# Patient Record
Sex: Female | Born: 1935 | Race: White | Hispanic: No | Smoking: Former smoker
Health system: Southern US, Community
[De-identification: ages and names within clinical notes are randomized; demographics above are authoritative.]

## PROBLEM LIST (undated history)

## (undated) DIAGNOSIS — M8580 Other specified disorders of bone density and structure, unspecified site: Secondary | ICD-10-CM

## (undated) DIAGNOSIS — E079 Disorder of thyroid, unspecified: Secondary | ICD-10-CM

## (undated) DIAGNOSIS — K219 Gastro-esophageal reflux disease without esophagitis: Secondary | ICD-10-CM

## (undated) DIAGNOSIS — K635 Polyp of colon: Secondary | ICD-10-CM

## (undated) DIAGNOSIS — G43909 Migraine, unspecified, not intractable, without status migrainosus: Secondary | ICD-10-CM

## (undated) HISTORY — PX: TONSILLECTOMY AND ADENOIDECTOMY: SUR1326

## (undated) HISTORY — DX: Migraine, unspecified, not intractable, without status migrainosus: G43.909

## (undated) HISTORY — PX: KNEE SURGERY: SHX244

## (undated) HISTORY — DX: Disorder of thyroid, unspecified: E07.9

## (undated) HISTORY — DX: Other specified disorders of bone density and structure, unspecified site: M85.80

## (undated) HISTORY — DX: Gastro-esophageal reflux disease without esophagitis: K21.9

## (undated) HISTORY — DX: Polyp of colon: K63.5

---

## 2008-08-06 DIAGNOSIS — E785 Hyperlipidemia, unspecified: Secondary | ICD-10-CM | POA: Insufficient documentation

## 2008-08-06 DIAGNOSIS — N390 Urinary tract infection, site not specified: Secondary | ICD-10-CM | POA: Insufficient documentation

## 2008-10-11 DIAGNOSIS — G4700 Insomnia, unspecified: Secondary | ICD-10-CM | POA: Insufficient documentation

## 2008-10-11 DIAGNOSIS — E039 Hypothyroidism, unspecified: Secondary | ICD-10-CM | POA: Insufficient documentation

## 2012-06-14 LAB — HM DEXA SCAN

## 2013-12-27 DIAGNOSIS — K219 Gastro-esophageal reflux disease without esophagitis: Secondary | ICD-10-CM | POA: Insufficient documentation

## 2015-07-22 ENCOUNTER — Other Ambulatory Visit: Payer: Self-pay | Admitting: Geriatric Medicine

## 2015-07-22 ENCOUNTER — Other Ambulatory Visit: Payer: Self-pay | Admitting: Internal Medicine

## 2015-07-22 DIAGNOSIS — R51 Headache: Principal | ICD-10-CM

## 2015-07-22 DIAGNOSIS — R519 Headache, unspecified: Secondary | ICD-10-CM

## 2015-07-26 ENCOUNTER — Inpatient Hospital Stay: Admission: RE | Admit: 2015-07-26 | Payer: Self-pay | Source: Ambulatory Visit

## 2015-08-13 ENCOUNTER — Ambulatory Visit: Payer: Self-pay | Admitting: Neurology

## 2015-08-19 ENCOUNTER — Ambulatory Visit: Payer: Self-pay | Admitting: Neurology

## 2015-09-08 ENCOUNTER — Ambulatory Visit: Payer: Self-pay | Admitting: Neurology

## 2015-11-24 ENCOUNTER — Encounter: Payer: Self-pay | Admitting: Internal Medicine

## 2015-11-24 ENCOUNTER — Ambulatory Visit (INDEPENDENT_AMBULATORY_CARE_PROVIDER_SITE_OTHER): Payer: Medicare Other | Admitting: Internal Medicine

## 2015-11-24 ENCOUNTER — Other Ambulatory Visit (INDEPENDENT_AMBULATORY_CARE_PROVIDER_SITE_OTHER): Payer: Medicare Other

## 2015-11-24 VITALS — BP 122/82 | HR 58 | Temp 98.5°F | Resp 18 | Ht 64.5 in | Wt 127.0 lb

## 2015-11-24 DIAGNOSIS — E039 Hypothyroidism, unspecified: Secondary | ICD-10-CM

## 2015-11-24 DIAGNOSIS — K219 Gastro-esophageal reflux disease without esophagitis: Secondary | ICD-10-CM

## 2015-11-24 DIAGNOSIS — G4700 Insomnia, unspecified: Secondary | ICD-10-CM | POA: Diagnosis not present

## 2015-11-24 LAB — COMPREHENSIVE METABOLIC PANEL
ALBUMIN: 4.3 g/dL (ref 3.5–5.2)
ALK PHOS: 73 U/L (ref 39–117)
ALT: 16 U/L (ref 0–35)
AST: 27 U/L (ref 0–37)
BUN: 21 mg/dL (ref 6–23)
CALCIUM: 9.3 mg/dL (ref 8.4–10.5)
CO2: 27 mEq/L (ref 19–32)
Chloride: 99 mEq/L (ref 96–112)
Creatinine, Ser: 0.8 mg/dL (ref 0.40–1.20)
GFR: 72.75 mL/min (ref >60.00)
Glucose, Bld: 89 mg/dL (ref 70–99)
POTASSIUM: 4.4 meq/L (ref 3.5–5.1)
Sodium: 134 mEq/L — ABNORMAL LOW (ref 135–145)
TOTAL PROTEIN: 7.5 g/dL (ref 6.0–8.3)
Total Bilirubin: 0.5 mg/dL (ref 0.2–1.2)

## 2015-11-24 LAB — LIPID PANEL
Cholesterol: 189 mg/dL (ref 0–200)
HDL: 66.7 mg/dL (ref >39.00)
LDL Cholesterol: 105 mg/dL — ABNORMAL HIGH (ref 0–99)
NONHDL: 122.23
Total CHOL/HDL Ratio: 3
Triglycerides: 86 mg/dL (ref 0.0–149.0)
VLDL: 17.2 mg/dL (ref 0.0–40.0)

## 2015-11-24 LAB — VITAMIN B12: Vitamin B-12: 572 pg/mL (ref 211–911)

## 2015-11-24 LAB — CBC
HCT: 39.7 % (ref 36.0–46.0)
HEMOGLOBIN: 13.5 g/dL (ref 12.0–15.0)
MCHC: 33.9 g/dL (ref 30.0–36.0)
MCV: 91.8 fl (ref 78.0–100.0)
PLATELETS: 223 10*3/uL (ref 150.0–400.0)
RBC: 4.32 Mil/uL (ref 3.87–5.11)
RDW: 12.2 % (ref 11.5–15.5)
WBC: 7.1 10*3/uL (ref 4.0–10.5)

## 2015-11-24 LAB — TSH: TSH: 2.64 u[IU]/mL (ref 0.35–4.50)

## 2015-11-24 NOTE — Assessment & Plan Note (Signed)
Taking remeron with good success and will continue.

## 2015-11-24 NOTE — Assessment & Plan Note (Signed)
Checking TSH and adjust as needed. Taking synthroid 75 mcg daily.  

## 2015-11-24 NOTE — Patient Instructions (Signed)
We will check the labs today and call you back with the results.   Keep up the good work with your health.   If things are going well we can see you back with our wellness nurse to do your medicare wellness visit this fall and then back with me in 6-12 months.   Health Maintenance, Female Adopting a healthy lifestyle and getting preventive care can go a long way to promote health and wellness. Talk with your health care provider about what schedule of regular examinations is right for you. This is a good chance for you to check in with your provider about disease prevention and staying healthy. In between checkups, there are plenty of things you can do on your own. Experts have done a lot of research about which lifestyle changes and preventive measures are most likely to keep you healthy. Ask your health care provider for more information. WEIGHT AND DIET  Eat a healthy diet  Be sure to include plenty of vegetables, fruits, low-fat dairy products, and lean protein.  Do not eat a lot of foods high in solid fats, added sugars, or salt.  Get regular exercise. This is one of the most important things you can do for your health.  Most adults should exercise for at least 150 minutes each week. The exercise should increase your heart rate and make you sweat (moderate-intensity exercise).  Most adults should also do strengthening exercises at least twice a week. This is in addition to the moderate-intensity exercise.  Maintain a healthy weight  Body mass index (BMI) is a measurement that can be used to identify possible weight problems. It estimates body fat based on height and weight. Your health care provider can help determine your BMI and help you achieve or maintain a healthy weight.  For females 80 years of age and older:   A BMI below 18.5 is considered underweight.  A BMI of 18.5 to 24.9 is normal.  A BMI of 25 to 29.9 is considered overweight.  A BMI of 30 and above is considered  obese.  Watch levels of cholesterol and blood lipids  You should start having your blood tested for lipids and cholesterol at 80 years of age, then have this test every 5 years.  You may need to have your cholesterol levels checked more often if:  Your lipid or cholesterol levels are high.  You are older than 80 years of age.  You are at high risk for heart disease.  CANCER SCREENING   Lung Cancer  Lung cancer screening is recommended for adults 80-80 years old who are at high risk for lung cancer because of a history of smoking.  A yearly low-dose CT scan of the lungs is recommended for people who:  Currently smoke.  Have quit within the past 15 years.  Have at least a 30-pack-year history of smoking. A pack year is smoking an average of one pack of cigarettes a day for 1 year.  Yearly screening should continue until it has been 15 years since you quit.  Yearly screening should stop if you develop a health problem that would prevent you from having lung cancer treatment.  Breast Cancer  Practice breast self-awareness. This means understanding how your breasts normally appear and feel.  It also means doing regular breast self-exams. Let your health care provider know about any changes, no matter how small.  If you are in your 80s or 80s, you should have a clinical breast exam (CBE) by  a health care provider every 1-3 years as part of a regular health exam.  If you are 80 or older, have a CBE every year. Also consider having a breast X-ray (mammogram) every year.  If you have a family history of breast cancer, talk to your health care provider about genetic screening.  If you are at high risk for breast cancer, talk to your health care provider about having an MRI and a mammogram every year.  Breast cancer gene (BRCA) assessment is recommended for women who have family members with BRCA-related cancers. BRCA-related cancers  include:  Breast.  Ovarian.  Tubal.  Peritoneal cancers.  Results of the assessment will determine the need for genetic counseling and BRCA1 and BRCA2 testing. Cervical Cancer Your health care provider may recommend that you be screened regularly for cancer of the pelvic organs (ovaries, uterus, and vagina). This screening involves a pelvic examination, including checking for microscopic changes to the surface of your cervix (Pap test). You may be encouraged to have this screening done every 3 years, beginning at age 80.  For women ages 10-65, health care providers may recommend pelvic exams and Pap testing every 3 years, or they may recommend the Pap and pelvic exam, combined with testing for human papilloma virus (HPV), every 5 years. Some types of HPV increase your risk of cervical cancer. Testing for HPV may also be done on women of any age with unclear Pap test results.  Other health care providers may not recommend any screening for nonpregnant women who are considered low risk for pelvic cancer and who do not have symptoms. Ask your health care provider if a screening pelvic exam is right for you.  If you have had past treatment for cervical cancer or a condition that could lead to cancer, you need Pap tests and screening for cancer for at least 20 years after your treatment. If Pap tests have been discontinued, your risk factors (such as having a new sexual partner) need to be reassessed to determine if screening should resume. Some women have medical problems that increase the chance of getting cervical cancer. In these cases, your health care provider may recommend more frequent screening and Pap tests. Colorectal Cancer  This type of cancer can be detected and often prevented.  Routine colorectal cancer screening usually begins at 80 years of age and continues through 80 years of age.  Your health care provider may recommend screening at an earlier age if you have risk factors for  colon cancer.  Your health care provider may also recommend using home test kits to check for hidden blood in the stool.  A small camera at the end of a tube can be used to examine your colon directly (sigmoidoscopy or colonoscopy). This is done to check for the earliest forms of colorectal cancer.  Routine screening usually begins at age 61.  Direct examination of the colon should be repeated every 5-10 years through 80 years of age. However, you may need to be screened more often if early forms of precancerous polyps or small growths are found. Skin Cancer  Check your skin from head to toe regularly.  Tell your health care provider about any new moles or changes in moles, especially if there is a change in a mole's shape or color.  Also tell your health care provider if you have a mole that is larger than the size of a pencil eraser.  Always use sunscreen. Apply sunscreen liberally and repeatedly throughout the day.  Protect yourself by wearing long sleeves, pants, a wide-brimmed hat, and sunglasses whenever you are outside. HEART DISEASE, DIABETES, AND HIGH BLOOD PRESSURE   High blood pressure causes heart disease and increases the risk of stroke. High blood pressure is more likely to develop in:  People who have blood pressure in the high end of the normal range (130-139/85-89 mm Hg).  People who are overweight or obese.  People who are African American.  If you are 18-39 years of age, have your blood pressure checked every 3-5 years. If you are 40 years of age or older, have your blood pressure checked every year. You should have your blood pressure measured twice--once when you are at a hospital or clinic, and once when you are not at a hospital or clinic. Record the average of the two measurements. To check your blood pressure when you are not at a hospital or clinic, you can use:  An automated blood pressure machine at a pharmacy.  A home blood pressure monitor.  If you  are between 55 years and 79 years old, ask your health care provider if you should take aspirin to prevent strokes.  Have regular diabetes screenings. This involves taking a blood sample to check your fasting blood sugar level.  If you are at a normal weight and have a low risk for diabetes, have this test once every three years after 80 years of age.  If you are overweight and have a high risk for diabetes, consider being tested at a younger age or more often. PREVENTING INFECTION  Hepatitis B  If you have a higher risk for hepatitis B, you should be screened for this virus. You are considered at high risk for hepatitis B if:  You were born in a country where hepatitis B is common. Ask your health care provider which countries are considered high risk.  Your parents were born in a high-risk country, and you have not been immunized against hepatitis B (hepatitis B vaccine).  You have HIV or AIDS.  You use needles to inject street drugs.  You live with someone who has hepatitis B.  You have had sex with someone who has hepatitis B.  You get hemodialysis treatment.  You take certain medicines for conditions, including cancer, organ transplantation, and autoimmune conditions. Hepatitis C  Blood testing is recommended for:  Everyone born from 1945 through 1965.  Anyone with known risk factors for hepatitis C. Sexually transmitted infections (STIs)  You should be screened for sexually transmitted infections (STIs) including gonorrhea and chlamydia if:  You are sexually active and are younger than 80 years of age.  You are older than 80 years of age and your health care provider tells you that you are at risk for this type of infection.  Your sexual activity has changed since you were last screened and you are at an increased risk for chlamydia or gonorrhea. Ask your health care provider if you are at risk.  If you do not have HIV, but are at risk, it may be recommended that you  take a prescription medicine daily to prevent HIV infection. This is called pre-exposure prophylaxis (PrEP). You are considered at risk if:  You are sexually active and do not regularly use condoms or know the HIV status of your partner(s).  You take drugs by injection.  You are sexually active with a partner who has HIV. Talk with your health care provider about whether you are at high risk of being infected   with HIV. If you choose to begin PrEP, you should first be tested for HIV. You should then be tested every 3 months for as long as you are taking PrEP.  PREGNANCY   If you are premenopausal and you may become pregnant, ask your health care provider about preconception counseling.  If you may become pregnant, take 400 to 800 micrograms (mcg) of folic acid every day.  If you want to prevent pregnancy, talk to your health care provider about birth control (contraception). OSTEOPOROSIS AND MENOPAUSE   Osteoporosis is a disease in which the bones lose minerals and strength with aging. This can result in serious bone fractures. Your risk for osteoporosis can be identified using a bone density scan.  If you are 28 years of age or older, or if you are at risk for osteoporosis and fractures, ask your health care provider if you should be screened.  Ask your health care provider whether you should take a calcium or vitamin D supplement to lower your risk for osteoporosis.  Menopause may have certain physical symptoms and risks.  Hormone replacement therapy may reduce some of these symptoms and risks. Talk to your health care provider about whether hormone replacement therapy is right for you.  HOME CARE INSTRUCTIONS   Schedule regular health, dental, and eye exams.  Stay current with your immunizations.   Do not use any tobacco products including cigarettes, chewing tobacco, or electronic cigarettes.  If you are pregnant, do not drink alcohol.  If you are breastfeeding, limit how  much and how often you drink alcohol.  Limit alcohol intake to no more than 1 drink per day for nonpregnant women. One drink equals 12 ounces of beer, 5 ounces of wine, or 1 ounces of hard liquor.  Do not use street drugs.  Do not share needles.  Ask your health care provider for help if you need support or information about quitting drugs.  Tell your health care provider if you often feel depressed.  Tell your health care provider if you have ever been abused or do not feel safe at home.   This information is not intended to replace advice given to you by your health care provider. Make sure you discuss any questions you have with your health care provider.   Document Released: 12/14/2010 Document Revised: 06/21/2014 Document Reviewed: 05/02/2013 Elsevier Interactive Patient Education [employer].

## 2015-11-24 NOTE — Assessment & Plan Note (Signed)
Taking prilosec and at this time benefit outweighs risk.

## 2015-11-24 NOTE — Progress Notes (Signed)
   Subjective:    Patient ID: Rachel ParkinsJulia Morillo, female    DOB: 01/11/1931, 82 y.o.   MRN: [id number]  HPI The patient is an 82 YO female coming in new for continuation of her medical care including her: thyroid (taking synthroid 75 mcg daily for some time, no constipation, diarrhea, heat or cold intolerance), her insomnia (taking remeron for that with good success, no daytime side effects, sleeping 6-7 hours per night), and her gerd (taking prilosec with good relief, if she misses a dose she notices). No new concerns. She is doing well overall.   [facility], social history reviewed and updated.   Review of Systems  Constitutional: Negative for fever, activity change, appetite change, fatigue and unexpected weight change.  HENT: Negative.   Eyes: Negative.   Respiratory: Negative for cough, chest tightness, shortness of breath and wheezing.   Cardiovascular: Negative for chest pain, palpitations and leg swelling.  Gastrointestinal: Negative for nausea, abdominal pain, diarrhea, constipation and abdominal distention.  Musculoskeletal: Negative.   Skin: Negative.   Neurological: Negative.   Psychiatric/Behavioral: Negative.       Objective:   Physical Exam  Constitutional: She is oriented to person, place, and time. She appears well-developed and well-nourished.  HENT:  Head: Normocephalic and atraumatic.  Eyes: EOM are normal.  Neck: Normal range of motion.  Cardiovascular: Normal rate and regular rhythm.   Pulmonary/Chest: Effort normal and breath sounds normal. No respiratory distress. She has no wheezes. She has no rales.  Abdominal: Soft. Bowel sounds are normal. She exhibits no distension. There is no tenderness. There is no rebound.  Musculoskeletal: She exhibits no edema.  Neurological: She is alert and oriented to person, place, and time. Coordination normal.  Skin: Skin is warm and dry.  Psychiatric: She has a normal mood and affect.   Filed Vitals:   11/24/15 1421  BP: 122/82    Pulse: 58  Temp: 98.5 F (36.9 C)  TempSrc: Oral  Resp: 18  Height: 5' 4.5" (1.638 m)  Weight: 127 lb (57.607 kg)  SpO2: 98%      Assessment & Plan:

## 2015-11-24 NOTE — Progress Notes (Signed)
Pre visit review using our clinic review tool, if applicable. No additional management support is needed unless otherwise documented below in the visit note. 

## 2015-11-25 ENCOUNTER — Ambulatory Visit: Payer: Medicare Other

## 2015-11-26 ENCOUNTER — Telehealth: Payer: Self-pay

## 2015-11-26 NOTE — Telephone Encounter (Signed)
Call to the patient for her AWV; States she is really in very good health; Works out every day; [facility] just there to complete her annual exam and explained that [facility] is not accountable for meeting our metrics. But in lieu of her good health., will defer to Dr. Okey Duprerawford at her CPE. Agreed to this and appreciated the feedback  Noted added for Dr. Okey Duprerawford to complete AWV in Dec.

## 2015-12-08 ENCOUNTER — Telehealth: Payer: Self-pay | Admitting: Internal Medicine

## 2015-12-08 NOTE — Telephone Encounter (Signed)
[facility] 15 pages to Dr.Crawford

## 2015-12-20 ENCOUNTER — Telehealth: Payer: Self-pay

## 2015-12-20 NOTE — Telephone Encounter (Signed)
Patient sent the following request via our website ( for non-medical questions) * Please inform her to call or use MyChart in the future so that her response is not delayed*  I do not now have cystitis but would like to have a supply of CIPROFLOXACIN 250MG  on hand in case I need it. [facility]. Thanks. Leonides SakeJulie Mcclard

## 2015-12-22 ENCOUNTER — Telehealth: Payer: Self-pay | Admitting: Internal Medicine

## 2015-12-22 MED ORDER — CIPROFLOXACIN HCL 500 MG PO TABS: 500.0000 mg | ORAL_TABLET | Freq: Two times a day (BID) | ORAL | Status: DC

## 2015-12-22 NOTE — Telephone Encounter (Signed)
Generally I do not give out antibiotics for people to have on hand but am willing to work with people if they do have UTI symptoms to make sure they get treatment.

## 2015-12-22 NOTE — Telephone Encounter (Signed)
7.10.17 Pt came in stated that she gets frequent UTI's and would like to keep cipro on hand this summer. She said she addressed this during her last visit with you and you said you would. Are you willing to send some in for her? Please advise, thanks.

## 2015-12-22 NOTE — Telephone Encounter (Signed)
Sent in the prescription of cipro.

## 2015-12-22 NOTE — Telephone Encounter (Signed)
Patient aware.

## 2015-12-29 NOTE — Telephone Encounter (Signed)
Dr. Okey Duprerawford sent in abx for patient.

## 2015-12-29 NOTE — Telephone Encounter (Signed)
Can you follow up with the patient with Dr. Frutoso Chaserawford's response. The patient requested meds via the website, she really needs to call us or use MyChart.

## 2016-04-06 ENCOUNTER — Ambulatory Visit (INDEPENDENT_AMBULATORY_CARE_PROVIDER_SITE_OTHER): Payer: Medicare Other | Admitting: Internal Medicine

## 2016-04-06 ENCOUNTER — Encounter: Payer: Self-pay | Admitting: Internal Medicine

## 2016-04-06 ENCOUNTER — Other Ambulatory Visit (INDEPENDENT_AMBULATORY_CARE_PROVIDER_SITE_OTHER): Payer: Medicare Other

## 2016-04-06 VITALS — BP 118/68 | HR 68 | Temp 97.6°F | Resp 16 | Ht 64.5 in | Wt 125.5 lb

## 2016-04-06 DIAGNOSIS — E038 Other specified hypothyroidism: Secondary | ICD-10-CM

## 2016-04-06 DIAGNOSIS — R197 Diarrhea, unspecified: Secondary | ICD-10-CM

## 2016-04-06 DIAGNOSIS — K580 Irritable bowel syndrome with diarrhea: Secondary | ICD-10-CM | POA: Diagnosis not present

## 2016-04-06 LAB — COMPREHENSIVE METABOLIC PANEL
ALT: 10 U/L (ref 0–35)
AST: 22 U/L (ref 0–37)
Albumin: 4.2 g/dL (ref 3.5–5.2)
Alkaline Phosphatase: 75 U/L (ref 39–117)
BUN: 18 mg/dL (ref 6–23)
CALCIUM: 9.5 mg/dL (ref 8.4–10.5)
CHLORIDE: 99 meq/L (ref 96–112)
CO2: 32 mEq/L (ref 19–32)
Creatinine, Ser: 0.87 mg/dL (ref 0.40–1.20)
GFR: 65.98 mL/min (ref >60.00)
Glucose, Bld: 91 mg/dL (ref 70–99)
Potassium: 4.9 mEq/L (ref 3.5–5.1)
Sodium: 135 mEq/L (ref 135–145)
Total Bilirubin: 0.7 mg/dL (ref 0.2–1.2)
Total Protein: 7.1 g/dL (ref 6.0–8.3)

## 2016-04-06 LAB — CBC WITH DIFFERENTIAL/PLATELET
BASOS PCT: 0.3 % (ref 0.0–3.0)
Basophils Absolute: 0 10*3/uL (ref 0.0–0.1)
EOS PCT: 0.7 % (ref 0.0–5.0)
Eosinophils Absolute: 0 10*3/uL (ref 0.0–0.7)
HEMATOCRIT: 38.5 % (ref 36.0–46.0)
HEMOGLOBIN: 13.1 g/dL (ref 12.0–15.0)
LYMPHS PCT: 26 % (ref 12.0–46.0)
Lymphs Abs: 1.4 10*3/uL (ref 0.7–4.0)
MCHC: 34.2 g/dL (ref 30.0–36.0)
MCV: 92.7 fl (ref 78.0–100.0)
Monocytes Absolute: 0.5 10*3/uL (ref 0.1–1.0)
Monocytes Relative: 9.4 % (ref 3.0–12.0)
NEUTROS ABS: 3.4 10*3/uL (ref 1.4–7.7)
Neutrophils Relative %: 63.6 % (ref 43.0–77.0)
PLATELETS: 217 10*3/uL (ref 150.0–400.0)
RBC: 4.15 Mil/uL (ref 3.87–5.11)
RDW: 12 % (ref 11.5–15.5)
WBC: 5.4 10*3/uL (ref 4.0–10.5)

## 2016-04-06 LAB — THYROID PANEL WITH TSH
FREE THYROXINE INDEX: 2.8 (ref 1.4–3.8)
T3 UPTAKE: 37 % — AB (ref 22–35)
T4 TOTAL: 7.7 ug/dL (ref 4.5–12.0)
TSH: 2.47 m[IU]/L

## 2016-04-06 LAB — C-REACTIVE PROTEIN: CRP: 0.1 mg/dL — ABNORMAL LOW (ref 0.5–20.0)

## 2016-04-06 MED ORDER — ELUXADOLINE 100 MG PO TABS: 1.0000 | ORAL_TABLET | Freq: Every day | ORAL | 1 refills | Status: DC

## 2016-04-06 MED ORDER — LEVOTHYROXINE SODIUM 75 MCG PO TABS: 75.0000 ug | ORAL_TABLET | Freq: Every day | ORAL | 1 refills | Status: DC

## 2016-04-06 NOTE — Progress Notes (Signed)
Pre visit review using our clinic review tool, if applicable. No additional management support is needed unless otherwise documented below in the visit note. 

## 2016-04-06 NOTE — Patient Instructions (Signed)
Chronic Diarrhea Diarrhea is frequent loose and watery bowel movements. It can cause you to feel weak and dehydrated. Dehydration can cause you to become tired and thirsty and to have a dry mouth, decreased urination, and dark yellow urine. Diarrhea is a sign of another problem, most often an infection that will not last long. In most cases, diarrhea lasts 2-3 days. Diarrhea that lasts longer than 4 weeks is called long-lasting (chronic) diarrhea. It is important to treat your diarrhea as directed by your health care provider to lessen or prevent future episodes of diarrhea.  CAUSES  There are many causes of chronic diarrhea. The following are some possible causes:   Gastrointestinal infections caused by viruses, bacteria, or parasites.   Food poisoning or food allergies.   Certain medicines, such as antibiotics, chemotherapy, and laxatives.   Artificial sweeteners and fructose.   Digestive disorders, such as celiac disease and inflammatory bowel diseases.   Irritable bowel syndrome.  Some disorders of the pancreas.  Disorders of the thyroid.  Reduced blood flow to the intestines.  Cancer. Sometimes the cause of chronic diarrhea is unknown. RISK FACTORS  Having a severely weakened immune system, such as from HIV or AIDS.   Taking certain types of cancer-fighting drugs (such as with chemotherapy) or other medicines.   Having had a recent organ transplant.   Having a portion of the stomach or small bowel removed.   Traveling to countries where food and water supplies are often contaminated.  SYMPTOMS  In addition to frequent, loose stools, diarrhea may cause:   Cramping.   Abdominal pain.   Nausea.   Fever.  Fatigue.  Urgent need to use the bathroom.  Loss of bowel control. DIAGNOSIS  Your health care provider must take a careful history and perform a physical exam. Tests given are based on your symptoms and history. Tests may include:   Blood or  stool tests. Three or more stool samples may be examined. Stool cultures may be used to test for bacteria or parasites.   X-rays.   A procedure in which a thin tube is inserted into the mouth or rectum (endoscopy). This allows the health care provider to look inside the intestine.  TREATMENT   Treatment is aimed at correcting the cause of the diarrhea when possible.  Diarrhea caused by an infection can often be treated with antibiotic medicines.  Diarrhea not caused by an infection may require you to take long-term medicine or have surgery. Specific treatment should be discussed with your health care provider.  If the cause cannot be determined, treatment aims to relieve symptoms and prevent dehydration. Serious health problems can occur if you do not maintain proper fluid levels. Treatment may include:  Taking an oral rehydration solution (ORS).  Not drinking beverages that contain caffeine (such as tea, coffee, and soft drinks).  Not drinking alcohol.  Maintaining well-balanced nutrition to help you recover faster. HOME CARE INSTRUCTIONS   Drink enough fluids to keep urine clear or pale yellow. Drink 1 cup (8 oz) of fluid for each diarrhea episode. Avoid fluids that contain simple sugars, fruit juices, whole milk products, and sodas. Hydrate with an ORS. You may purchase the ORS or prepare it at home by mixing the following ingredients together:   - tsp (1.7-3  mL) table salt.   tsp (3  mL) baking soda.   tsp (1.7 mL) salt substitute containing potassium chloride.  1 tbsp (20 mL) sugar.  4.2 c (1 L) of water.     Certain foods and beverages may increase the speed at which food moves through the gastrointestinal (GI) tract. These foods and beverages should be avoided. They include:  Caffeinated and alcoholic beverages.  High-fiber foods, such as raw fruits and vegetables, nuts, seeds, and whole grain breads and cereals.  Foods and beverages sweetened with sugar  alcohols, such as xylitol, sorbitol, and mannitol.   Some foods may be well tolerated and may help thicken stool. These include:  Starchy foods, such as rice, toast, pasta, low-sugar cereal, oatmeal, grits, baked potatoes, crackers, and bagels.  Bananas.  Applesauce.  Add probiotic-rich foods to help increase healthy bacteria in the GI tract. These include yogurt and fermented milk products.  Wash your hands well after each diarrhea episode.  Only take over-the-counter or prescription medicines as directed by your health care provider.  Take a warm bath to relieve any burning or pain from frequent diarrhea episodes. SEEK MEDICAL CARE IF:   You are not urinating as often.  Your urine is a dark color.  You become very tired or dizzy.  You have severe pain in the abdomen or rectum.  Your have blood or pus in your stools.  Your stools look black and tarry. SEEK IMMEDIATE MEDICAL CARE IF:   You are unable to keep fluids down.  You have persistent vomiting.  You have blood in your stool.  Your stools are black and tarry.  You do not urinate in 6-8 hours, or there is only a small amount of very dark urine.  You have abdominal pain that increases or localizes.  You have weakness, dizziness, confusion, or lightheadedness.  You have a severe headache.  Your diarrhea gets worse or does not get better.  You have a fever or persistent symptoms for more than 2-3 days.  You have a fever and your symptoms suddenly get worse. MAKE SURE YOU:   Understand these instructions.  Will watch your condition.  Will get help right away if you are not doing well or get worse.   This information is not intended to replace advice given to you by your health care provider. Make sure you discuss any questions you have with your health care provider.   Document Released: 08/21/2003 Document Revised: 06/05/2013 Document Reviewed: 11/23/2012 Elsevier Interactive Patient Education 2016  [employer]  

## 2016-04-06 NOTE — Progress Notes (Signed)
Subjective:  Patient ID: Rachel Ferguson, female    DOB: June 13, 1931  Age: 82 y.o. MRN: [id number]  CC: Hypothyroidism and Diarrhea  NEW TO ME  HPI [name] presents for concerns about loose bowel movements. She said she doesn't have a great memory but she thinks she's had some trouble with this throughout her life. Nonetheless, for the last 6 months or so her symptoms have worsened. She complains of having about 4-5 loose bowel movements per day with stool urgency. She has rare abdominal cramping. She has noticed no associations with wheat products, dairy products, or other food substances. She takes Imodium intermittently and it does provide symptom relief. She is not awakened during the night with diarrhea. She has not seen any blood in her stools and she denies nausea, vomiting, loss of appetite, weight loss. She tells me she has done a Microbiologist and is convinced she has IBS with diarrhea.  Outpatient Medications Prior to Visit  Medication Sig Dispense Refill  . mirtazapine (REMERON) 30 MG tablet Take 7.5 mg by mouth at bedtime.     Marland Kitchen omeprazole (PRILOSEC) 20 MG capsule Take 20 mg by mouth daily.     . ciprofloxacin (CIPRO) 500 MG tablet Take 1 tablet (500 mg total) by mouth 2 (two) times daily. 6 tablet 0  . levothyroxine (SYNTHROID, LEVOTHROID) 75 MCG tablet Take 75 mcg by mouth daily before breakfast.      No facility-administered medications prior to visit.     ROS Review of Systems  Constitutional: Negative for activity change, appetite change, chills, diaphoresis, fatigue, fever and unexpected weight change.  HENT: Negative.  Negative for trouble swallowing.   Eyes: Negative for visual disturbance.  Respiratory: Negative for cough, choking, chest tightness, shortness of breath and stridor.   Cardiovascular: Negative for chest pain, palpitations and leg swelling.  Gastrointestinal: Positive for diarrhea. Negative for abdominal pain, blood in stool, constipation, nausea and  vomiting.  Endocrine: Negative.   Genitourinary: Negative.  Negative for decreased urine volume, difficulty urinating and dysuria.  Musculoskeletal: Negative.   Skin: Negative.  Negative for color change and rash.  Allergic/Immunologic: Negative.   Neurological: Negative.  Negative for dizziness, weakness and light-headedness.  Hematological: Negative.  Negative for adenopathy. Does not bruise/bleed easily.  Psychiatric/Behavioral: Negative.  Negative for sleep disturbance. The patient is not nervous/anxious.     Objective:  BP 118/68 (BP Location: Left Arm, Patient Position: Sitting, Cuff Size: Normal)   Pulse 68   Temp 97.6 F (36.4 C) (Oral)   Resp 16   Ht 5' 4.5" (1.638 m)   Wt 125 lb 8 oz (56.9 kg)   SpO2 97%   BMI 21.21 kg/m   BP Readings from Last 3 Encounters:  04/06/16 118/68  11/24/15 122/82    Wt Readings from Last 3 Encounters:  04/06/16 125 lb 8 oz (56.9 kg)  11/24/15 127 lb (57.6 kg)    Physical Exam  Constitutional: She is oriented to person, place, and time. No distress.  HENT:  Mouth/Throat: Oropharynx is clear and moist. No oropharyngeal exudate.  Eyes: Conjunctivae are normal. Right eye exhibits no discharge. Left eye exhibits no discharge. No scleral icterus.  Neck: Normal range of motion. Neck supple. No JVD present. No tracheal deviation present.  Cardiovascular: Normal rate, regular rhythm, normal heart sounds and intact distal pulses.  Exam reveals no gallop and no friction rub.   No murmur heard. Pulmonary/Chest: Effort normal and breath sounds normal. No stridor. No respiratory distress. She has  no wheezes. She has no rales. She exhibits no tenderness.  Abdominal: Soft. Bowel sounds are normal. She exhibits no distension and no mass. There is no tenderness. There is no rebound and no guarding.  Musculoskeletal: Normal range of motion. She exhibits no edema, tenderness or deformity.  Lymphadenopathy:    She has no cervical adenopathy.    Neurological: She is oriented to person, place, and time.  Skin: Skin is warm and dry. No rash noted. She is not diaphoretic. No erythema. No pallor.  Psychiatric: She has a normal mood and affect. Her behavior is normal. Judgment and thought content normal.  Vitals reviewed.   Lab Results  Component Value Date   WBC 5.4 04/06/2016   HGB 13.1 04/06/2016   HCT 38.5 04/06/2016   PLT 217.0 04/06/2016   GLUCOSE 91 04/06/2016   CHOL 189 11/24/2015   TRIG 86.0 11/24/2015   HDL 66.70 11/24/2015   LDLCALC 105 (H) 11/24/2015   ALT 10 04/06/2016   AST 22 04/06/2016   NA 135 04/06/2016   K 4.9 04/06/2016   CL 99 04/06/2016   CREATININE 0.87 04/06/2016   BUN 18 04/06/2016   CO2 32 04/06/2016   TSH 2.47 04/06/2016    Patient was never admitted.  Assessment & Plan:   Derrika was seen today for hypothyroidism and diarrhea.  Diagnoses and all orders for this visit:  Other specified hypothyroidism- Her TSH is in the normal range, she will remain on the current dose of levothyroxine -     CBC with Differential/Platelet; Future -     Thyroid Panel With TSH; Future  Irritable bowel syndrome with diarrhea- she will try a course of Viberzi to see if this helps control her symptoms. -     Eluxadoline (VIBERZI) 100 MG TABS; Take 1 tablet by mouth daily.  Diarrhea, unspecified type- I am suspicious that she has IBS with diarrhea so I have asked her try a course of Viberzi, will also screen her for celiac disease, exocrine pancreatic insufficiency, bacterial overgrowth, C. difficile infection, inflammatory bowel disease, hyperthyroidism, abnormal electrolytes, anemia, low protein states. -     C-reactive protein; Future -     Clostridium difficile EIA; Future -     Giardia/cryptosporidium (EIA); Future -     Fecal lactoferrin, quant; Future -     Ova and parasite examination; Future -     Comprehensive metabolic panel; Future -     CBC with Differential/Platelet; Future -     Pancreatic  Elastase, Fecal -     Gliadin antibodies, serum; Future -     Tissue transglutaminase, IgA; Future -     Reticulin Antibody, IgA w reflex titer; Future -     Thyroid Panel With TSH; Future  Other orders -     levothyroxine (SYNTHROID, LEVOTHROID) 75 MCG tablet; Take 1 tablet (75 mcg total) by mouth daily before breakfast.   I have discontinued Ms. Seher's ciprofloxacin. I have also changed her levothyroxine. Additionally, I am having her start on Eluxadoline. Lastly, I am having her maintain her mirtazapine and omeprazole.  Meds ordered this encounter  Medications  . levothyroxine (SYNTHROID, LEVOTHROID) 75 MCG tablet    Sig: Take 1 tablet (75 mcg total) by mouth daily before breakfast.    Dispense:  90 tablet    Refill:  1  . Eluxadoline (VIBERZI) 100 MG TABS    Sig: Take 1 tablet by mouth daily.    Dispense:  90 tablet    Refill:  1     Follow-up: Return in about 3 weeks (around 04/27/2016).  Sanda Lingerhomas Daria Mcmeekin, MD

## 2016-04-08 ENCOUNTER — Encounter: Payer: Self-pay | Admitting: Internal Medicine

## 2016-04-08 LAB — GLIADIN ANTIBODIES, SERUM
Gliadin IgA: 12 Units (ref <20)
Gliadin IgG: 2 Units (ref <20)

## 2016-04-08 LAB — RETICULIN ANTIBODIES, IGA W TITER: Reticulin Ab, IgA: NEGATIVE

## 2016-04-08 LAB — TISSUE TRANSGLUTAMINASE, IGA: Tissue Transglutaminase Ab, IgA: 1 U/mL (ref <4)

## 2016-04-12 ENCOUNTER — Telehealth: Payer: Self-pay | Admitting: Emergency Medicine

## 2016-04-12 ENCOUNTER — Other Ambulatory Visit: Payer: Self-pay | Admitting: Internal Medicine

## 2016-04-12 ENCOUNTER — Other Ambulatory Visit: Payer: Medicare Other

## 2016-04-12 DIAGNOSIS — R197 Diarrhea, unspecified: Secondary | ICD-10-CM

## 2016-04-12 DIAGNOSIS — L989 Disorder of the skin and subcutaneous tissue, unspecified: Secondary | ICD-10-CM

## 2016-04-12 NOTE — Telephone Encounter (Signed)
Pt called and stated her dermatologist is retiring and wants to know if she can be referred to a new one. Please advise thanks.

## 2016-04-13 LAB — C. DIFFICILE GDH AND TOXIN A/B
C. DIFFICILE GDH: NOT DETECTED
C. difficile Toxin A/B: NOT DETECTED

## 2016-04-13 LAB — OVA AND PARASITE EXAMINATION: OP: NONE SEEN

## 2016-04-13 LAB — FECAL LACTOFERRIN, QUANT: LACTOFERRIN: POSITIVE

## 2016-04-13 NOTE — Telephone Encounter (Signed)
What is she needing referral for? We need a reason for the referral.

## 2016-04-14 ENCOUNTER — Encounter: Payer: Self-pay | Admitting: Internal Medicine

## 2016-04-14 NOTE — Telephone Encounter (Signed)
Patient wants to be checked all over to make sure she is not having any problems. She said she has not been in a year and her old dermatologist is retiring. Please advise, thanks.

## 2016-04-14 NOTE — Telephone Encounter (Signed)
Referral placed.

## 2016-04-14 NOTE — Addendum Note (Signed)
Addended by: Hillard DankerRAWFORD, ELIZABETH A on: 04/14/2016 10:37 AM   Modules accepted: Orders

## 2016-04-15 LAB — GIARDIA/CRYPTOSPORIDIUM (EIA)

## 2016-04-27 ENCOUNTER — Encounter: Payer: Self-pay | Admitting: Internal Medicine

## 2016-04-27 ENCOUNTER — Ambulatory Visit (INDEPENDENT_AMBULATORY_CARE_PROVIDER_SITE_OTHER): Payer: Medicare Other | Admitting: Internal Medicine

## 2016-04-27 DIAGNOSIS — R197 Diarrhea, unspecified: Secondary | ICD-10-CM

## 2016-04-27 NOTE — Progress Notes (Signed)
   Subjective:    Patient ID: Rachel ParkinsJulia Ferguson, female    DOB: 03/21/1931, 82 y.o.   MRN: [id number]  HPI The patient is an 82 YO female coming in for follow up of some diarrhea. She was seen several weeks ago for acute flare of diarrhea with a hard time getting off the toilet due to multiple soft stools. Since that time she was given viberzi and tried that. This gave her horrible side effects and she was unable to leave the bed. She got rid of the rest. She had improvement on her own. No real change to her diet or exercise before the worsening or change to cause the improvement. She did not have any blood in the bowels. She did not get the results of her stool studies and wants to review those. Has taken imodium and it constipated her for 2-3 days. No weight change.  Review of Systems  Constitutional: Negative.   Respiratory: Negative.   Cardiovascular: Negative.   Gastrointestinal: Positive for diarrhea. Negative for abdominal distention, abdominal pain, constipation, nausea and vomiting.       Much improved  Musculoskeletal: Negative.   Neurological: Negative.       Objective:   Physical Exam  Constitutional: She is oriented to person, place, and time. She appears well-developed and well-nourished.  Cardiovascular: Normal rate and regular rhythm.   Pulmonary/Chest: Effort normal. No respiratory distress. She has no wheezes. She has no rales.  Abdominal: Soft. Bowel sounds are normal. She exhibits no distension and no mass. There is no tenderness. There is no rebound and no guarding.  Neurological: She is alert and oriented to person, place, and time.  Skin: Skin is warm and dry.   Vitals:   04/27/16 1047  BP: 130/78  Pulse: 60  Resp: 14  Temp: 98.4 F (36.9 C)  TempSrc: Oral  SpO2: 98%  Weight: 128 lb (58.1 kg)  Height: 5\' 4"  (1.626 m)      Assessment & Plan:

## 2016-04-27 NOTE — Patient Instructions (Signed)
It is fine to push the physical back to next year.   Consider taking some imodium with you on the trip just in case and you can use 1/2 pill if needed.

## 2016-04-27 NOTE — Progress Notes (Signed)
Pre visit review using our clinic review tool, if applicable. No additional management support is needed unless otherwise documented below in the visit note. 

## 2016-04-27 NOTE — Assessment & Plan Note (Signed)
Reviewed normal stool and labs results with her at the visit. Advised her to stop viberzi and not resume given the side effects. Advised that she can try 1/2 imodium to see if this is able to give her some relief without constipating her.

## 2016-05-05 ENCOUNTER — Telehealth: Payer: Self-pay | Admitting: Internal Medicine

## 2016-05-05 ENCOUNTER — Ambulatory Visit (INDEPENDENT_AMBULATORY_CARE_PROVIDER_SITE_OTHER): Payer: Medicare Other | Admitting: Physician Assistant

## 2016-05-05 VITALS — BP 138/78 | HR 66 | Temp 98.0°F | Resp 17 | Ht 65.0 in | Wt 125.0 lb

## 2016-05-05 DIAGNOSIS — R05 Cough: Secondary | ICD-10-CM

## 2016-05-05 DIAGNOSIS — R0981 Nasal congestion: Secondary | ICD-10-CM | POA: Diagnosis not present

## 2016-05-05 DIAGNOSIS — R059 Cough, unspecified: Secondary | ICD-10-CM

## 2016-05-05 LAB — POCT CBC
Granulocyte percent: 62.8 %G (ref 37–80)
HEMATOCRIT: 38.1 % (ref 37.7–47.9)
HEMOGLOBIN: 13.5 g/dL (ref 12.2–16.2)
LYMPH, POC: 1.2 (ref 0.6–3.4)
MCH, POC: 32.6 pg — AB (ref 27–31.2)
MCHC: 35.6 g/dL — AB (ref 31.8–35.4)
MCV: 91.6 fL (ref 80–97)
MID (cbc): 0.4 (ref 0–0.9)
MPV: 8.8 fL (ref 0–99.8)
POC GRANULOCYTE: 2.8 (ref 2–6.9)
POC LYMPH PERCENT: 27.2 %L (ref 10–50)
POC MID %: 10 %M (ref 0–12)
Platelet Count, POC: 170 10*3/uL (ref 142–424)
RBC: 4.15 M/uL (ref 4.04–5.48)
RDW, POC: 12.2 %
WBC: 4.5 10*3/uL — AB (ref 4.6–10.2)

## 2016-05-05 MED ORDER — BENZONATATE 100 MG PO CAPS: 100.0000 mg | ORAL_CAPSULE | Freq: Three times a day (TID) | ORAL | 0 refills | Status: DC | PRN

## 2016-05-05 MED ORDER — AZITHROMYCIN 250 MG PO TABS: ORAL_TABLET | ORAL | 0 refills | Status: DC

## 2016-05-05 MED ORDER — FLUTICASONE PROPIONATE 50 MCG/ACT NA SUSP: 2.0000 | Freq: Every day | NASAL | 0 refills | Status: DC

## 2016-05-05 NOTE — Telephone Encounter (Signed)
[facility] - Client TELEPHONE ADVICE RECORD [facility] Patient Name: Rachel Ferguson DOB: 12/21/1930 Initial Comment Caller thinks she is coming down with bronchitis. cough, hoarse, hard time talking. Nurse Assessment Nurse: Lane HackerHarley, RN, Elvin SoWindy Date/Time Lamount Cohen(Eastern Time): 05/05/2016 9:04:44 AM Confirm and document reason for call. If symptomatic, describe symptoms. You must click the next button to save text entered. ---Caller thinks she is coming down with bronchitis (has had h/ o having bronchitis 2-3 times prior). c/o productive cough - clear phlegm, hoarseness, hard time talking. S/S started Saturday, this past weekend. Can I have a Z pack & cough syrup with codeine prescribed? Does the patient have any new or worsening symptoms? ---Yes Will a triage be completed? ---Yes Related visit to physician within the last 2 weeks? ---No Does the PT have any chronic conditions? (i.e. diabetes, asthma, etc.) ---No Is this a behavioral health or substance abuse call? ---No Guidelines Guideline Title Affirmed Question Affirmed Notes Cough - Acute Productive ALSO, mild central chest pain occurs only when coughing Final Disposition User [facility] [place], RN, [place] Comments A little chest pain with coughing. She would like to know what the doctor thinks. Uses [facility]. Disagree/Comply: Comply

## 2016-05-05 NOTE — Progress Notes (Signed)
MRN: [id number] DOB: 06/30/1930  Subjective:   Rachel Ferguson is a 82 y.o. female presenting for chief complaint of Cough (onset 5 days) .  Reports 5 day history of productive cough (no hemoptysis) and  fatigue.  Denies fever, sinus congestion, ear pain, wheezing, shortness of breath, chest pain and myalgia, subjective fever, night sweats, chills, nausea, vomiting, abdominal pain and diarrhea. Has not tried anything for relief. Has had not had sick contact with anyone. No history of seasonal allergies orasthma. Patient has had flu shot this season. Denies smoking. Denies any other aggravating or relieving factors, no other questions or concerns. She has a history of bronchitis. Denies history of pneumonia.   Rachel Ferguson has a current medication list which includes the following prescription(s): levothyroxine, mirtazapine, and omeprazole. Also is allergic to ciprofloxacin.  Rachel Ferguson  has a past medical history of Colon polyps; GERD (gastroesophageal reflux disease); Migraines; Osteopenia; and Thyroid disease. Also  has a past surgical history that includes Tonsillectomy and adenoidectomy and Knee surgery (Right).      Objective:   Vitals: BP 138/78 (BP Location: Right Arm, Patient Position: Sitting, Cuff Size: Normal)   Pulse 66   Temp 98 F (36.7 C) (Oral)   Resp 17   Ht 5\' 5"  (1.651 m)   Wt 125 lb (56.7 kg)   SpO2 97%   BMI 20.80 kg/m   Physical Exam  Constitutional: She is oriented to person, place, and time. She appears well-developed and well-nourished.  Appears mildly uncomfortable.    HENT:  Head: Normocephalic and atraumatic.  Right Ear: Tympanic membrane, external ear and ear canal normal.  Left Ear: Tympanic membrane and external ear normal.  Nose: Mucosal edema ( right side more prominent) and rhinorrhea present. Right sinus exhibits maxillary sinus tenderness (mild). Right sinus exhibits no frontal sinus tenderness. Left sinus exhibits maxillary sinus tenderness (mild). Left  sinus exhibits no frontal sinus tenderness.  Mouth/Throat: Uvula is midline and mucous membranes are normal. Posterior oropharyngeal erythema present.  Eyes: Conjunctivae are normal.  Neck: Normal range of motion.  Cardiovascular: Normal rate, regular rhythm and normal heart sounds.   Pulmonary/Chest: Effort normal and breath sounds normal. She has no wheezes. She has no rhonchi.  Neurological: She is alert and oriented to person, place, and time.  Skin: Skin is warm and dry.  Psychiatric: She has a normal mood and affect.  Vitals reviewed.    Results for orders placed or performed in visit on 05/05/16 (from the past 24 hour(s))  POCT CBC     Status: Abnormal   Collection Time: 05/05/16  4:54 PM  Result Value Ref Range   WBC 4.5 (A) 4.6 - 10.2 K/uL   Lymph, poc 1.2 0.6 - 3.4   POC LYMPH PERCENT 27.2 10 - 50 %L   MID (cbc) 0.4 0 - 0.9   POC MID % 10.0 0 - 12 %M   POC Granulocyte 2.8 2 - 6.9   Granulocyte percent 62.8 37 - 80 %G   RBC 4.15 4.04 - 5.48 M/uL   Hemoglobin 13.5 12.2 - 16.2 g/dL   HCT, POC 52.838.1 41.337.7 - 47.9 %   MCV 91.6 80 - 97 fL   MCH, POC 32.6 (A) 27 - 31.2 pg   MCHC 35.6 (A) 31.8 - 35.4 g/dL   RDW, POC 24.412.2 %   Platelet Count, POC 170 142 - 424 K/uL   MPV 8.8 0 - 99.8 fL    Assessment and Plan :   1. Cough -  Likely viral etiology. I have thoroughly educated the patient on viral vs. bacterial etiology. Encouraged pt to try and treat this symptomatically and if in 3-5 days there is no improvement or if at anytime she starts to get worse she can fill the prescription for the antibiotic that I printed out for her. She understands and agrees to treatment.   - POCT CBC - benzonatate (TESSALON) 100 MG capsule; Take 1-2 capsules (100-200 mg total) by mouth 3 (three) times daily as needed for cough.  Dispense: 40 capsule; Refill: 0 - azithromycin (ZITHROMAX) 250 MG tablet; Take 2 tabs PO x 1 dose, then 1 tab PO QD x 4 days  Dispense: 6 tablet; Refill: 0  2. Nasal  congestion - fluticasone (FLONASE) 50 MCG/ACT nasal spray; Place 2 sprays into both nostrils daily.  Dispense: 16 g; Refill: 0 -Return to clinic if symptoms worsen, do not improve, or as needed  Benjiman CoreBrittany Selma Rodelo, PA-C  [facility] 05/05/2016 4:55 PM

## 2016-05-05 NOTE — Patient Instructions (Addendum)
- We will treat this as a respiratory viral infection.  - I recommend you rest, drink plenty of fluids, eat light meals including soups.  -You may use Tessalon pearls during the day for your cough and flonase for the nasal congestion. - You may also use Tylenol or ibuprofen over-the-counter for your sore throat. -I have printed you a prescription for a zpack and I would encourage that you hold off a few days to fill it to see if you can treat this symptomatically.   - Please let me know if you are not seeing any improvement or get worse in 7 days.     Acute Bronchitis, Adult Acute bronchitis is when air tubes (bronchi) in the lungs suddenly get swollen. The condition can make it hard to breathe. It can also cause these symptoms:  A cough.  Coughing up clear, yellow, or Viviano mucus.  Wheezing.  Chest congestion.  Shortness of breath.  A fever.  Body aches.  Chills.  A sore throat. Follow these instructions at home: Medicines  Take over-the-counter and prescription medicines only as told by your doctor.  If you were prescribed an antibiotic medicine, take it as told by your doctor. Do not stop taking the antibiotic even if you start to feel better. General instructions  Rest.  Drink enough fluids to keep your pee (urine) clear or pale yellow.  Avoid smoking and secondhand smoke. If you smoke and you need help quitting, ask your doctor. Quitting will help your lungs heal faster.  Use an inhaler, cool mist vaporizer, or humidifier as told by your doctor.  Keep all follow-up visits as told by your doctor. This is important. How is this prevented? To lower your risk of getting this condition again:  Wash your hands often with soap and water. If you cannot use soap and water, use hand sanitizer.  Avoid contact with people who have cold symptoms.  Try not to touch your hands to your mouth, nose, or eyes.  Make sure to get the flu shot every year. Contact a doctor  if:  Your symptoms do not get better in 2 weeks. Get help right away if:  You cough up blood.  You have chest pain.  You have very bad shortness of breath.  You become dehydrated.  You faint (pass out) or keep feeling like you are going to pass out.  You keep throwing up (vomiting).  You have a very bad headache.  Your fever or chills gets worse. This information is not intended to replace advice given to you by your health care provider. Make sure you discuss any questions you have with your health care provider. Document Released: 11/17/2007 Document Revised: 01/07/2016 Document Reviewed: 11/19/2015 Elsevier Interactive Patient Education  2017 [employer]   IF you received an x-ray today, you will receive an invoice from [facility]. Please contact [facility] at [phone] with questions or concerns regarding your invoice.   IF you received labwork today, you will receive an invoice from [facility]. Please contact [facility] at [phone] with questions or concerns regarding your invoice.   Our billing staff will not be able to assist you with questions regarding bills from these companies.  You will be contacted with the lab results as soon as they are available. The fastest way to get your results is to activate your My Chart account. Instructions are located on the last page of this paperwork. If you have not heard from us regarding the results  in 2 weeks, please contact this office.

## 2016-05-05 NOTE — Telephone Encounter (Signed)
This is likely viral and does not need an antibiotic. If she worsens recommend visit.

## 2016-05-05 NOTE — Telephone Encounter (Signed)
Tried to schedule an appt for patient on Friday. She said she needs something sooner and she declined the visit on Friday.

## 2016-05-18 ENCOUNTER — Encounter: Payer: Medicare Other | Admitting: Internal Medicine

## 2016-05-27 ENCOUNTER — Encounter: Payer: Self-pay | Admitting: Internal Medicine

## 2016-05-27 ENCOUNTER — Ambulatory Visit (INDEPENDENT_AMBULATORY_CARE_PROVIDER_SITE_OTHER)
Admission: RE | Admit: 2016-05-27 | Discharge: 2016-05-27 | Disposition: A | Discharge status: Home / Self Care | Payer: Medicare Other | Source: Ambulatory Visit | Attending: Internal Medicine | Admitting: Internal Medicine

## 2016-05-27 ENCOUNTER — Ambulatory Visit (INDEPENDENT_AMBULATORY_CARE_PROVIDER_SITE_OTHER): Payer: Medicare Other | Admitting: Internal Medicine

## 2016-05-27 ENCOUNTER — Other Ambulatory Visit (INDEPENDENT_AMBULATORY_CARE_PROVIDER_SITE_OTHER): Payer: Medicare Other

## 2016-05-27 VITALS — BP 134/74 | HR 73 | Temp 98.0°F | Ht 64.0 in | Wt 128.2 lb

## 2016-05-27 DIAGNOSIS — R0789 Other chest pain: Secondary | ICD-10-CM

## 2016-05-27 DIAGNOSIS — R5383 Other fatigue: Secondary | ICD-10-CM

## 2016-05-27 DIAGNOSIS — R911 Solitary pulmonary nodule: Secondary | ICD-10-CM | POA: Diagnosis not present

## 2016-05-27 DIAGNOSIS — R072 Precordial pain: Secondary | ICD-10-CM

## 2016-05-27 LAB — THYROID PANEL WITH TSH
Free Thyroxine Index: 2.8 (ref 1.4–3.8)
T3 UPTAKE: 35 % (ref 22–35)
T4, Total: 8 ug/dL (ref 4.5–12.0)
TSH: 3.02 mIU/L

## 2016-05-27 LAB — COMPREHENSIVE METABOLIC PANEL
ALBUMIN: 4.2 g/dL (ref 3.5–5.2)
ALT: 11 U/L (ref 0–35)
AST: 21 U/L (ref 0–37)
Alkaline Phosphatase: 87 U/L (ref 39–117)
BUN: 17 mg/dL (ref 6–23)
CALCIUM: 9 mg/dL (ref 8.4–10.5)
CHLORIDE: 102 meq/L (ref 96–112)
CO2: 30 mEq/L (ref 19–32)
CREATININE: 0.87 mg/dL (ref 0.40–1.20)
GFR: 65.95 mL/min (ref >60.00)
Glucose, Bld: 73 mg/dL (ref 70–99)
Potassium: 4.6 mEq/L (ref 3.5–5.1)
SODIUM: 138 meq/L (ref 135–145)
Total Bilirubin: 0.6 mg/dL (ref 0.2–1.2)
Total Protein: 7.2 g/dL (ref 6.0–8.3)

## 2016-05-27 LAB — CARDIAC PANEL
CK-MB: 1.8 ng/mL (ref 0.3–4.0)
Relative Index: 3.4 calc — ABNORMAL HIGH (ref 0.0–2.5)
Total CK: 53 U/L (ref 7–177)

## 2016-05-27 LAB — TROPONIN I: TNIDX: 0 ug/L (ref 0.00–0.06)

## 2016-05-27 NOTE — Progress Notes (Addendum)
Subjective:  Patient ID: Rachel ParkinsJulia Ferguson, female    DOB: 05/03/1931  Age: 82 y.o. MRN: [id number]  CC: Chest Pain   HPI Rachel ParkinsJulia Hunn presents for feeling poorly for about a month. She was seen about a month ago and an urgent care center for cough. An x-ray was not done. She tells me the cough has not been very problematic but now she has a diffuse sensation of pain across her anterior chest and severe fatigue. She has also had a couple of episodes of left jaw pain after she saw her dentist. She denies hemoptysis, shortness of breath, night sweats, fever, chills, weight loss. She has had a slight decrease in her appetite. She denies DOE, diaphoresis, palpitations, or edema.  Outpatient Medications Prior to Visit  Medication Sig Dispense Refill  . fluticasone (FLONASE) 50 MCG/ACT nasal spray Place 2 sprays into both nostrils daily. 16 g 0  . levothyroxine (SYNTHROID, LEVOTHROID) 75 MCG tablet Take 1 tablet (75 mcg total) by mouth daily before breakfast. 90 tablet 1  . mirtazapine (REMERON) 30 MG tablet Take 7.5 mg by mouth at bedtime.     Marland Kitchen. omeprazole (PRILOSEC) 20 MG capsule Take 20 mg by mouth daily.     Marland Kitchen. azithromycin (ZITHROMAX) 250 MG tablet Take 2 tabs PO x 1 dose, then 1 tab PO QD x 4 days 6 tablet 0  . benzonatate (TESSALON) 100 MG capsule Take 1-2 capsules (100-200 mg total) by mouth 3 (three) times daily as needed for cough. 40 capsule 0   No facility-administered medications prior to visit.     ROS Review of Systems  Constitutional: Positive for fatigue. Negative for appetite change, chills, diaphoresis, fever and unexpected weight change.  HENT: Negative.   Eyes: Negative for visual disturbance.  Respiratory: Positive for cough. Negative for chest tightness, shortness of breath, wheezing and stridor.   Cardiovascular: Positive for chest pain. Negative for palpitations and leg swelling.  Gastrointestinal: Negative.  Negative for abdominal pain, blood in stool, constipation, diarrhea,  nausea and vomiting.  Endocrine: Negative.  Negative for cold intolerance and heat intolerance.  Genitourinary: Negative.  Negative for decreased urine volume, difficulty urinating, dysuria, flank pain, hematuria and urgency.  Musculoskeletal: Negative for arthralgias, back pain, myalgias and neck pain.  Skin: Negative.   Allergic/Immunologic: Negative.   Neurological: Negative.  Negative for dizziness, tremors, weakness, light-headedness and headaches.  Hematological: Negative.  Negative for adenopathy. Does not bruise/bleed easily.  Psychiatric/Behavioral: Negative.     Objective:  BP 134/74 (BP Location: Left Arm, Patient Position: Sitting, Cuff Size: Normal)   Pulse 73   Temp 98 F (36.7 C) (Oral)   Ht 5\' 4"  (1.626 m)   Wt 128 lb 4 oz (58.2 kg)   SpO2 98%   BMI 22.01 kg/m   BP Readings from Last 3 Encounters:  05/27/16 134/74  05/05/16 138/78  04/27/16 130/78    Wt Readings from Last 3 Encounters:  05/27/16 128 lb 4 oz (58.2 kg)  05/05/16 125 lb (56.7 kg)  04/27/16 128 lb (58.1 kg)    Physical Exam  Constitutional: She is oriented to person, place, and time. No distress.  HENT:  Mouth/Throat: Oropharynx is clear and moist. No oropharyngeal exudate.  Eyes: Conjunctivae are normal. Right eye exhibits no discharge. Left eye exhibits no discharge. No scleral icterus.  Neck: Normal range of motion. Neck supple. No JVD present. No tracheal deviation present. No thyromegaly present.  Cardiovascular: Normal rate, regular rhythm, normal heart sounds and intact distal pulses.  Exam reveals no gallop and no friction rub.   No murmur heard. EKG ---  Sinus  Rhythm  Voltage loss in V3 Normal ST/T waves No old EKG for comparison  Pulmonary/Chest: Effort normal and breath sounds normal. No stridor. No respiratory distress. She has no decreased breath sounds. She has no wheezes. She has no rhonchi. She has no rales. She exhibits no mass, no tenderness, no bony tenderness, no  crepitus, no edema and no deformity.  Abdominal: Soft. Bowel sounds are normal. She exhibits no distension and no mass. There is no tenderness. There is no rebound and no guarding.  Musculoskeletal: Normal range of motion. She exhibits no edema, tenderness or deformity.  Lymphadenopathy:    She has no cervical adenopathy.  Neurological: She is oriented to person, place, and time.  Skin: Skin is warm and dry. No rash noted. She is not diaphoretic. No erythema. No pallor.  Vitals reviewed.   Lab Results  Component Value Date   WBC 4.5 (A) 05/05/2016   HGB 13.5 05/05/2016   HCT 38.1 05/05/2016   PLT 217.0 04/06/2016   GLUCOSE 91 04/06/2016   CHOL 189 11/24/2015   TRIG 86.0 11/24/2015   HDL 66.70 11/24/2015   LDLCALC 105 (H) 11/24/2015   ALT 10 04/06/2016   AST 22 04/06/2016   NA 135 04/06/2016   K 4.9 04/06/2016   CL 99 04/06/2016   CREATININE 0.87 04/06/2016   BUN 18 04/06/2016   CO2 32 04/06/2016   TSH 2.47 04/06/2016    Patient was never admitted.  Assessment & Plan:   Amil AmenJulia was seen today for chest pain.  Diagnoses and all orders for this visit:  Precordial pain- her chest pain is not typical for ischemia but she is 85 and has a slightly abnormal EKG. Will check her cardiac enzymes and of asked her to undergo a Lexiscan to screen for coronary artery disease -     DG Chest 2 View; Future -     Troponin I; Future -     Cardiac panel; Future -     Myocardial Perfusion Imaging; Future  Fatigue, unspecified type- we'll screen her for coronary artery disease and will check for hypothyroidism, calcium and other electrolyte abnormalities. -     Thyroid Panel With TSH; Future -     Comprehensive metabolic panel; Future  Atypical chest pain- her chest x-ray is abnormal, will get a CT To see if this explains her pain and will also start a workup for ischemia -     EKG 12-Lead -     DG Chest 2 View; Future -     Troponin I; Future -     Cardiac panel; Future -      Myocardial Perfusion Imaging; Future  Nodule of apex of right lung- this is concerning for malignancy, will get a CT with contrast done to see if they could also be infectious or inflammatory. -     CT CHEST W CONTRAST; Future   I have discontinued Ms. Hochstein's benzonatate and azithromycin. I am also having her maintain her mirtazapine, omeprazole, levothyroxine, fluticasone, cyclobenzaprine, and fluorouracil.  Meds ordered this encounter  Medications  . cyclobenzaprine (FLEXERIL) 10 MG tablet    Sig: Take 10 mg by mouth 3 (three) times daily.    Refill:  0  . fluorouracil (EFUDEX) 5 % cream     Follow-up: Return in about 2 weeks (around 06/10/2016).  Sanda Lingerhomas Jerre Diguglielmo, MD

## 2016-05-27 NOTE — Patient Instructions (Signed)
Chest Wall Pain °Chest wall pain is pain in or around the bones and muscles of your chest. Sometimes, an injury causes this pain. Sometimes, the cause may not be known. This pain may take several weeks or longer to get better. °Follow these instructions at home: °Pay attention to any changes in your symptoms. Take these actions to help with your pain: °· Rest as told by your health care provider. °· Avoid activities that cause pain. These include any activities that use your chest muscles or your abdominal and side muscles to lift heavy items. °· If directed, apply ice to the painful area: °¨ Put ice in a plastic bag. °¨ Place a towel between your skin and the bag. °¨ Leave the ice on for 20 minutes, 2-3 times per day. °· Take over-the-counter and prescription medicines only as told by your health care provider. °· Do not use tobacco products, including cigarettes, chewing tobacco, and e-cigarettes. If you need help quitting, ask your health care provider. °· Keep all follow-up visits as told by your health care provider. This is important. °Contact a health care provider if: °· You have a fever. °· Your chest pain becomes worse. °· You have new symptoms. °Get help right away if: °· You have nausea or vomiting. °· You feel sweaty or light-headed. °· You have a cough with phlegm (sputum) or you cough up blood. °· You develop shortness of breath. °This information is not intended to replace advice given to you by your health care provider. Make sure you discuss any questions you have with your health care provider. °Document Released: 05/31/2005 Document Revised: 10/09/2015 Document Reviewed: 08/26/2014 °Elsevier Interactive Patient Education © 2017 [employer] ° °

## 2016-05-27 NOTE — Progress Notes (Signed)
Pre visit review using our clinic review tool, if applicable. No additional management support is needed unless otherwise documented below in the visit note. 

## 2016-05-28 ENCOUNTER — Inpatient Hospital Stay: Admission: RE | Admit: 2016-05-28 | Payer: Medicare Other | Source: Ambulatory Visit

## 2016-05-31 ENCOUNTER — Ambulatory Visit (INDEPENDENT_AMBULATORY_CARE_PROVIDER_SITE_OTHER)
Admission: RE | Admit: 2016-05-31 | Discharge: 2016-05-31 | Disposition: A | Discharge status: Home / Self Care | Payer: Medicare Other | Source: Ambulatory Visit | Attending: Internal Medicine | Admitting: Internal Medicine

## 2016-05-31 DIAGNOSIS — R911 Solitary pulmonary nodule: Secondary | ICD-10-CM | POA: Diagnosis not present

## 2016-05-31 MED ORDER — IOPAMIDOL (ISOVUE-300) INJECTION 61%
80.0000 mL | Freq: Once | INTRAVENOUS | Status: AC | PRN
  Administered 2016-05-31: 80 mL via INTRAVENOUS

## 2016-06-01 ENCOUNTER — Telehealth (HOSPITAL_COMMUNITY): Payer: Self-pay | Admitting: *Deleted

## 2016-06-01 NOTE — Telephone Encounter (Signed)
Patient given detailed instructions per Myocardial Perfusion Study Information Sheet for the test on 06/03/16. Patient notified to arrive 15 minutes early and that it is imperative to arrive on time for appointment to keep from having the test rescheduled.  If you need to cancel or reschedule your appointment, please call the office within 24 hours of your appointment. Failure to do so may result in a cancellation of your appointment, and a $50 no show fee. Patient verbalized understanding. Rachel Ferguson Jacqueline    

## 2016-06-03 ENCOUNTER — Ambulatory Visit (HOSPITAL_COMMUNITY): Payer: Medicare Other | Attending: Internal Medicine

## 2016-06-03 DIAGNOSIS — R072 Precordial pain: Secondary | ICD-10-CM | POA: Insufficient documentation

## 2016-06-03 DIAGNOSIS — R0789 Other chest pain: Secondary | ICD-10-CM | POA: Insufficient documentation

## 2016-06-03 LAB — MYOCARDIAL PERFUSION IMAGING
CHL CUP RESTING HR STRESS: 57 {beats}/min
CSEPPHR: 88 {beats}/min
LV dias vol: 61 mL (ref 46–106)
LVSYSVOL: 20 mL
RATE: 0.31
SDS: 2
SRS: 6
SSS: 8
TID: 0.99

## 2016-06-03 MED ORDER — TECHNETIUM TC 99M TETROFOSMIN IV KIT
32.0000 | PACK | Freq: Once | INTRAVENOUS | Status: AC | PRN
  Administered 2016-06-03: 32 via INTRAVENOUS
  Filled 2016-06-03: qty 32

## 2016-06-03 MED ORDER — TECHNETIUM TC 99M TETROFOSMIN IV KIT
10.9000 | PACK | Freq: Once | INTRAVENOUS | Status: AC | PRN
  Administered 2016-06-03: 10.9 via INTRAVENOUS
  Filled 2016-06-03: qty 11

## 2016-06-03 MED ORDER — REGADENOSON 0.4 MG/5ML IV SOLN
0.4000 mg | Freq: Once | INTRAVENOUS | Status: AC
  Administered 2016-06-03: 0.4 mg via INTRAVENOUS

## 2016-06-09 ENCOUNTER — Encounter (HOSPITAL_COMMUNITY): Payer: Medicare Other

## 2016-07-02 ENCOUNTER — Encounter: Payer: Self-pay | Admitting: Sports Medicine

## 2016-07-02 ENCOUNTER — Ambulatory Visit (INDEPENDENT_AMBULATORY_CARE_PROVIDER_SITE_OTHER): Payer: Medicare Other | Admitting: Sports Medicine

## 2016-07-02 VITALS — BP 132/80 | HR 60 | Temp 97.8°F | Ht 64.5 in | Wt 129.8 lb

## 2016-07-02 DIAGNOSIS — M545 Low back pain, unspecified: Secondary | ICD-10-CM | POA: Insufficient documentation

## 2016-07-02 DIAGNOSIS — M9905 Segmental and somatic dysfunction of pelvic region: Secondary | ICD-10-CM | POA: Insufficient documentation

## 2016-07-02 DIAGNOSIS — M9903 Segmental and somatic dysfunction of lumbar region: Secondary | ICD-10-CM | POA: Insufficient documentation

## 2016-07-02 MED ORDER — BACLOFEN 10 MG PO TABS: 10.0000 mg | ORAL_TABLET | Freq: Three times a day (TID) | ORAL | 1 refills | Status: DC | PRN

## 2016-07-02 NOTE — Progress Notes (Signed)
Rachel Ferguson - 83 y.o. female MRN [id number]  Date of birth: 06-Jun-1931  Office Visit Note: Visit Date: 07/02/2016 PCP: Myrlene Broker, MD Referred by: Myrlene Broker, *  Subjective: Chief Complaint  Patient presents with  . Back Pain    x 2 weeks. Pt. strained back during long care drive.    HPI: Patient reports 2 weeks of worsening low back pain after a prolonged car ride.  She denies any known trauma.  She denies any radicular symptoms.  No changes in bowel or bladder.  No fevers, chills or recent weight loss.  She does not have any significant nighttime awakenings due to this.  She has been seen and integrative therapies in the past and reports it does seem to be helping but has worsened now.  She denies any significant low back symptoms prior to 2 weeks ago. ROS:  Otherwise per HPI.   Clinical History: No specialty comments available.  She reports that she quit smoking about 60 years ago. She has a 1.25 pack-year smoking history. She has never used smokeless tobacco.  No results for input(s): HGBA1C, LABURIC in the last 8760 hours.  Assessment & Plan: Visit Diagnoses:    ICD-9-CM ICD-10-CM   1. Acute bilateral low back pain without sciatica 724.2 M54.5    338.19    2. Segmental and somatic dysfunction of pelvic region 739.5 M99.05   3. Segmental and somatic dysfunction of lumbar region 739.3 M99.03     Plan:Cconservative Treatment at this time.  [facility] spine conditioning program provided.  Osteopathic manipulation to lumbar spine and pelvis performed today with moderate improvement in symptoms.  Follow-up: Return in about 4 weeks (around 07/30/2016) for with Dr. Katrinka Blazing.  Consider further diagnostic imaging if any persistent ongoing symptoms.  Also consider repeat osteopathic manipulation as she did tolerate this well today.  Meds:  Meds ordered this encounter  Medications  . baclofen (LIORESAL) 10 MG tablet    Sig: Take 1 tablet (10 mg total) by mouth 3 (three)  times daily as needed for muscle spasms.    Dispense:  60 tablet    Refill:  1   Procedures: No notes on file   Objective:  VS:  HT:5' 4.5" (163.8 cm)   WT:129 lb 12 oz (58.9 kg)  BMI:22    BP:132/80  HR:60bpm  TEMP:97.8 F (36.6 C)(Oral)  RESP:98 % Physical Exam:  Elderly female.  No acute distress.  Poor insight. BACK: Bilateral negative straight leg raise. Sensation intact to light touch in bilateral lower extremities No significant midline tenderness.  Mild tenderness to palpation over bilateral paraspinal lumbar musculature. Good internal and external rotation of the hips. Manual muscle testing is 5+/5 without focality Osteopathic exam:  Lumbar spine: L4 flexed, rotated right, side bent right    L3 flexed, rotated left, side bent left  Pelvis: Right anterior innominate  Imaging: No results found.  Past Medical/Family/Surgical/Social History: Medications & Allergies reviewed per EMR Patient Active Problem List   Diagnosis Date Noted  . Acute bilateral low back pain without sciatica 07/02/2016  . Segmental and somatic dysfunction of pelvic region 07/02/2016  . Segmental and somatic dysfunction of lumbar region 07/02/2016  . Precordial pain 05/27/2016  . Fatigue 05/27/2016  . Atypical chest pain 05/27/2016  . Nodule of apex of right lung 05/27/2016  . Diarrhea 04/06/2016  . Hypothyroidism 11/24/2015  . GERD (gastroesophageal reflux disease) 11/24/2015  . Insomnia 11/24/2015   Past Medical History:  Diagnosis Date  . Colon  polyps   . GERD (gastroesophageal reflux disease)   . Migraines   . Osteopenia   . Thyroid disease    Family History  Problem Relation Age of Onset  . Cancer Mother     breast  . Heart disease Father   . Alzheimer's disease Sister   . Heart disease Maternal Grandmother   . Tuberculosis Paternal Grandmother   . Cancer Sister   . Melanoma Sister    Past Surgical History:  Procedure Laterality Date  . KNEE SURGERY Right   .  TONSILLECTOMY AND ADENOIDECTOMY     Social History   Occupational History  . Not on file.   Social History Main Topics  . Smoking status: Former Smoker    Packs/day: 0.25    Years: 5.00    Quit date: 05/05/1956  . Smokeless tobacco: Never Used  . Alcohol use 8.4 oz/week    14 Standard drinks or equivalent per week  . Drug use: No  . Sexual activity: Not on file

## 2016-07-02 NOTE — Assessment & Plan Note (Signed)
PROCEDURE NOTE : Osteopathic Manipulation. The decision today to treat with OMT was based on physical exam. Verbal consent was obtained after after explanation of risks, benefits and potential side effects, including acute pain flare, post manipulation soreness and need for repeat treatments.             Regions treated:  Lumbar and Pelvic          Techniques used:  ME A patient tolerated procedure well with reported improvement in symptoms. Patient given medications, exercises, stretches and lifestyle modifications per AVS and verbally.

## 2016-07-08 ENCOUNTER — Telehealth: Payer: Self-pay | Admitting: Internal Medicine

## 2016-07-08 NOTE — Telephone Encounter (Signed)
Pt's daughter called request referral to go to neurology for dementia. Please advise.

## 2016-07-09 NOTE — Telephone Encounter (Signed)
Patients daughter contacted and LVM to schedule visit for refferal

## 2016-07-09 NOTE — Telephone Encounter (Signed)
We have never seen her for this and would need to see her to do the referral.

## 2016-07-30 ENCOUNTER — Encounter: Payer: Self-pay | Admitting: Family Medicine

## 2016-07-30 ENCOUNTER — Ambulatory Visit (INDEPENDENT_AMBULATORY_CARE_PROVIDER_SITE_OTHER): Payer: Medicare Other | Admitting: Family Medicine

## 2016-07-30 DIAGNOSIS — M545 Low back pain, unspecified: Secondary | ICD-10-CM

## 2016-07-30 NOTE — Assessment & Plan Note (Signed)
Patient does have low back pain. With patient having decreasing range of motion of the pelvic area especially with external rotation of the hip this is likely more of a sacroiliac dysfunction versus possible hip arthritis. We discussed with patient at great length. Patient encouraged to try some home exercises and work with Event organiserathletic trainer. We discussed icing regimen. We discussed over-the-counter medications. I do not believe that manipulation would be very beneficial. Worsening symptoms we'll consider this as well as formal physical therapy as well as x-rays. Follow-up again in 4 weeks

## 2016-07-30 NOTE — Patient Instructions (Addendum)
Good to see you.  I am fine with the heat if it is helping.  Exercises 3 times a week.  More specific exercises this time.  Vitamin D 2000 IU daily  Turmeric 500mg  daily  Tart cherry extract any dose at night Make sure you wear good shoes.  See me again in 4 weeks to make sure you are doing better.

## 2016-07-30 NOTE — Progress Notes (Signed)
[name] [address] [place], [place] [address] Phone: [phone] Subjective:    I'm seeing this patient by the request  of:  Myrlene Broker, MD   CC: low back pain   [id number]  Rachel Ferguson is a 83 y.o. female coming in with complaint of low back pain. Patient states that she does have some back pain. Only thing that seems to help his heat. Been going on for multiple weeks if not months. Does not very to injury. Denies any fever, chills, any abnormal weight loss. Patient states that is severely uncomfortable. Patient denies any nighttime awakening. Patient tries to stay active. Has been given home exercises on a handout by another provider with no significant improvement. Patient also had osteopathic manipulation that seem to help from how worse and then seemed to come right back. Denies of the radiation down the legs or any numbness. Rates the severity pain is 4 out of 10.     Past Medical History:  Diagnosis Date  . Colon polyps   . GERD (gastroesophageal reflux disease)   . Migraines   . Osteopenia   . Thyroid disease    Past Surgical History:  Procedure Laterality Date  . KNEE SURGERY Right   . TONSILLECTOMY AND ADENOIDECTOMY     Social History   Social History  . Marital status: Widowed    Spouse name: N/A  . Number of children: N/A  . Years of education: N/A   Social History Main Topics  . Smoking status: Former Smoker    Packs/day: 0.25    Years: 5.00    Quit date: 05/05/1956  . Smokeless tobacco: Never Used  . Alcohol use 8.4 oz/week    14 Standard drinks or equivalent per week  . Drug use: No  . Sexual activity: Not on file   Other Topics Concern  . Not on file   Social History Narrative  . No narrative on file   Allergies  Allergen Reactions  . Ciprofloxacin     Had knee pain after taking Cipro. Other reaction(s): Other (See Comments) Had knee pain after taking Cipro.   Family History  Problem  Relation Age of Onset  . Cancer Mother     breast  . Heart disease Father   . Alzheimer's disease Sister   . Heart disease Maternal Grandmother   . Tuberculosis Paternal Grandmother   . Cancer Sister   . Melanoma Sister     Past medical history, social, surgical and family history all reviewed in electronic medical record.  No pertanent information unless stated regarding to the chief complaint.   Review of Systems:Review of systems updated and as accurate as of 07/30/16  No headache, visual changes, nausea, vomiting, diarrhea, constipation, dizziness, abdominal pain, skin rash, fevers, chills, night sweats, weight loss, swollen lymph nodes, body aches, , chest pain, shortness of breath, mood changes. Positive for body aches, muscle aches, joint pain  Objective  There were no vitals taken for this visit. Systems examined below as of 07/30/16   General: No apparent distress alert and oriented x3 mood and affect normal, dressed appropriately.  HEENT: Pupils equal, extraocular movements intact  Respiratory: Patient's speak in full sentences and does not appear short of breath  Cardiovascular: No lower extremity edema, non tender, no erythema  Skin: Warm dry intact with no signs of infection or rash on extremities or on axial skeleton.  Abdomen: Soft nontender  Neuro: Cranial nerves II through XII are  intact, neurovascularly intact in all extremities with 2+ DTRs and 2+ pulses.  Lymph: No lymphadenopathy of posterior or anterior cervical chain or axillae bilaterally.  Gait normal with good balance and coordination.  MSK:  Non tender with full range of motion and good stability and symmetric strength and tone of shoulders, elbows, wrist, hip, knee and ankles bilaterally. Arthritic changes of multiple joints  Back Exam:  Inspection:Significant thoracolumbar scoliosis noted  Motion: Flexion 25 deg, Extension 15 deg, Side Bending to 30 deg bilaterally,  Rotation to 30 deg bilaterally  SLR  laying: Negative  XSLR laying: Negative  Palpable tenderness:Tender to palpation in the paraspinal musculature of the lumbar spine.Marland Kitchen. FABER:Patient is severely decreased range of motion of the hips with external rotation right greater than left.. Sensory change: Gross sensation intact to all lumbar and sacral dermatomes.  Reflexes: 2+ at both patellar tendons, 2+ at achilles tendons, Babinski's downgoing.  Strength at foot  Plantar-flexion: 5/5 Dorsi-flexion: 5/5 Eversion: 5/5 Inversion: 5/5  Leg strength  Quad: 5/5 Hamstring: 5/5 Hip flexor: 5/5 Hip abductors: 5/5  Gait unremarkable.   Procedure note 97110; 15 minutes spent for Therapeutic exercises as stated in above notes.  This included exercises focusing on stretching, strengthening, with significant focus on eccentric aspects.  Hip strengthening exercises which included:  Pelvic tilt/bracing to help with proper recruitment of the lower abs and pelvic floor muscles  Glute strengthening to properly contract glutes without over-engaging low back and hamstrings - prone hip extension and glute bridge exercises Proper stretching techniques to increase effectiveness for the hip flexors, groin, quads, piriformic and low back when appropriate    Proper technique shown and discussed handout in great detail with ATC.  All questions were discussed and answered.      Impression and Recommendations:     This case required medical decision making of moderate complexity.      Note: This dictation was prepared with Dragon dictation along with smaller phrase technology. Any transcriptional errors that result from this process are unintentional.

## 2016-08-25 ENCOUNTER — Telehealth: Payer: Self-pay | Admitting: Internal Medicine

## 2016-08-25 MED ORDER — LEVOTHYROXINE SODIUM 75 MCG PO TABS: 75.0000 ug | ORAL_TABLET | Freq: Every day | ORAL | 1 refills | Status: DC

## 2016-08-25 NOTE — Telephone Encounter (Signed)
Patient is requesting a refill on these medication. I was trying to explain to her that two of them she has not had a refill on since 2014. But she just keep saying she needed these 3 medication.   Levothyroxine  Mirtazapine Omeprazole  [facility] Please follow up with patient once done & or if you need to explain anything to her. Thank you.

## 2016-08-25 NOTE — Telephone Encounter (Signed)
Refill for levothyroxine sent in, patient also requesting refill for mirtazapine and omeprazole, patient has not received these two medications since 2014. Please advise

## 2016-08-26 NOTE — Telephone Encounter (Signed)
Contacted patient and asked if she remebered the doctor that prescribed the 2 medications before coming here. Patient did not remember name, stated that she would more than likely need to come in for a visit,  Patient was very adamant about getting the two medications and I stated to her that there has not been a discussion in visit for them to be prescribed. Patient went on and on as to why she needed but as stated to patient that I could not send it in because it has not been prescribed by PCP, patient is gong to make office visit to discuss

## 2016-08-26 NOTE — Progress Notes (Deleted)
Tawana ScaleZach Victorhugo Preis D.O. [facility] [address] [place], [place] [address] Phone: [phone] Subjective:    I'm seeing this patient by the request  of:  Myrlene BrokerElizabeth A Crawford, MD   CC: low back pain   BJY:NWGNFAOZHYHPI:Subjective  Rachel ParkinsJulia Ferguson is a 83 y.o. female coming in with complaint of low back pain. Patient states that she does have some back pain. Only thing that seems to help his heat. Been going on for multiple weeks if not months. Does not very to injury. Denies any fever, chills, any abnormal weight loss. Patient states that is severely uncomfortable. Patient denies any nighttime awakening. Patient tries to stay active. Has been given home exercises on a handout by another provider with no significant improvement. Patient also had osteopathic manipulation that seem to help from how worse and then seemed to come right back. Denies of the radiation down the legs or any numbness. Rates the severity pain is 4 out of 10.     Past Medical History:  Diagnosis Date  . Colon polyps   . GERD (gastroesophageal reflux disease)   . Migraines   . Osteopenia   . Thyroid disease    Past Surgical History:  Procedure Laterality Date  . KNEE SURGERY Right   . TONSILLECTOMY AND ADENOIDECTOMY     Social History   Social History  . Marital status: Widowed    Spouse name: N/A  . Number of children: N/A  . Years of education: N/A   Social History Main Topics  . Smoking status: Former Smoker    Packs/day: 0.25    Years: 5.00    Quit date: 05/05/1956  . Smokeless tobacco: Never Used  . Alcohol use 8.4 oz/week    14 Standard drinks or equivalent per week  . Drug use: No  . Sexual activity: Not on file   Other Topics Concern  . Not on file   Social History Narrative  . No narrative on file   Allergies  Allergen Reactions  . Ciprofloxacin     Had knee pain after taking Cipro. Other reaction(s): Other (See Comments) Had knee pain after taking Cipro.   Family History  Problem  Relation Age of Onset  . Cancer Mother     breast  . Heart disease Father   . Alzheimer's disease Sister   . Heart disease Maternal Grandmother   . Tuberculosis Paternal Grandmother   . Cancer Sister   . Melanoma Sister     Past medical history, social, surgical and family history all reviewed in electronic medical record.  No pertanent information unless stated regarding to the chief complaint.   Review of Systems:Review of systems updated and as accurate as of 08/26/16  No headache, visual changes, nausea, vomiting, diarrhea, constipation, dizziness, abdominal pain, skin rash, fevers, chills, night sweats, weight loss, swollen lymph nodes, body aches, , chest pain, shortness of breath, mood changes. Positive for body aches, muscle aches, joint pain  Objective  There were no vitals taken for this visit. Systems examined below as of 08/26/16   General: No apparent distress alert and oriented x3 mood and affect normal, dressed appropriately.  HEENT: Pupils equal, extraocular movements intact  Respiratory: Patient's speak in full sentences and does not appear short of breath  Cardiovascular: No lower extremity edema, non tender, no erythema  Skin: Warm dry intact with no signs of infection or rash on extremities or on axial skeleton.  Abdomen: Soft nontender  Neuro: Cranial nerves II through XII are  intact, neurovascularly intact in all extremities with 2+ DTRs and 2+ pulses.  Lymph: No lymphadenopathy of posterior or anterior cervical chain or axillae bilaterally.  Gait normal with good balance and coordination.  MSK:  Non tender with full range of motion and good stability and symmetric strength and tone of shoulders, elbows, wrist, hip, knee and ankles bilaterally. Arthritic changes of multiple joints  Back Exam:  Inspection:Significant thoracolumbar scoliosis noted  Motion: Flexion 25 deg, Extension 15 deg, Side Bending to 30 deg bilaterally,  Rotation to 30 deg bilaterally  SLR  laying: Negative  XSLR laying: Negative  Palpable tenderness:Tender to palpation in the paraspinal musculature of the lumbar spine.Marland Kitchen. FABER:Patient is severely decreased range of motion of the hips with external rotation right greater than left.. Sensory change: Gross sensation intact to all lumbar and sacral dermatomes.  Reflexes: 2+ at both patellar tendons, 2+ at achilles tendons, Babinski's downgoing.  Strength at foot  Plantar-flexion: 5/5 Dorsi-flexion: 5/5 Eversion: 5/5 Inversion: 5/5  Leg strength  Quad: 5/5 Hamstring: 5/5 Hip flexor: 5/5 Hip abductors: 5/5  Gait unremarkable.   Procedure note 97110; 15 minutes spent for Therapeutic exercises as stated in above notes.  This included exercises focusing on stretching, strengthening, with significant focus on eccentric aspects.  Hip strengthening exercises which included:  Pelvic tilt/bracing to help with proper recruitment of the lower abs and pelvic floor muscles  Glute strengthening to properly contract glutes without over-engaging low back and hamstrings - prone hip extension and glute bridge exercises Proper stretching techniques to increase effectiveness for the hip flexors, groin, quads, piriformic and low back when appropriate    Proper technique shown and discussed handout in great detail with ATC.  All questions were discussed and answered.      Impression and Recommendations:     This case required medical decision making of moderate complexity.      Note: This dictation was prepared with Dragon dictation along with smaller phrase technology. Any transcriptional errors that result from this process are unintentional.

## 2016-08-26 NOTE — Telephone Encounter (Signed)
Since we have never prescribed need them to verify dosing of meds. Then we will send in.

## 2016-08-27 ENCOUNTER — Ambulatory Visit: Payer: Medicare Other | Admitting: Family Medicine

## 2016-08-27 MED ORDER — MIRTAZAPINE 7.5 MG PO TABS: 7.5000 mg | ORAL_TABLET | Freq: Every day | ORAL | 6 refills | Status: DC

## 2016-08-27 MED ORDER — OMEPRAZOLE 20 MG PO CPDR: 20.0000 mg | DELAYED_RELEASE_CAPSULE | Freq: Every day | ORAL | 3 refills | Status: DC

## 2016-08-27 NOTE — Telephone Encounter (Signed)
patient stated mirtazapine bottle says 30 mg, but patient stated that she only takes 1/4 so 7.5 mg is what she wants for mirtazapine, patient also stated the 20mg  for omeprazole is correct.  Patient uses [facility]

## 2016-08-27 NOTE — Telephone Encounter (Signed)
Patient contacted and aware                                                                                                          

## 2016-08-27 NOTE — Telephone Encounter (Signed)
She does not need visit as stated. We just need to verify dosing. We have discussed these at visit.

## 2016-08-27 NOTE — Telephone Encounter (Signed)
Sent in 7.5 mg pills of remeron so she should take 1 pill at bedtime. Also sent in the omeprazole.

## 2016-08-30 ENCOUNTER — Ambulatory Visit: Payer: Medicare Other | Admitting: Internal Medicine

## 2016-10-19 ENCOUNTER — Encounter: Payer: Self-pay | Admitting: Sports Medicine

## 2016-10-19 ENCOUNTER — Ambulatory Visit (INDEPENDENT_AMBULATORY_CARE_PROVIDER_SITE_OTHER): Payer: Medicare Other | Admitting: Sports Medicine

## 2016-10-19 ENCOUNTER — Ambulatory Visit: Payer: Self-pay

## 2016-10-19 ENCOUNTER — Ambulatory Visit: Payer: Medicare Other | Admitting: Sports Medicine

## 2016-10-19 ENCOUNTER — Ambulatory Visit (INDEPENDENT_AMBULATORY_CARE_PROVIDER_SITE_OTHER): Payer: Medicare Other

## 2016-10-19 DIAGNOSIS — M25561 Pain in right knee: Secondary | ICD-10-CM | POA: Diagnosis not present

## 2016-10-19 NOTE — Progress Notes (Signed)
OFFICE VISIT NOTE [facility] [phone]  Barrett Holthaus - 83 y.o. female MRN [id number]  Date of birth: 1931/04/08  Visit Date: 10/19/2016  PCP: Myrlene Broker, MD   Referred by: Myrlene Broker, *  Clovis Cao, cma acting as scribe for Dr. Berline Chough.  SUBJECTIVE:   Chief Complaint  Patient presents with  . NP: Right Knee Pain   HPI: As below and per problem based documentation when appropriate.  Anner fell over a stump while walking in the woods 2 weeks ago. No redness, bruising, or swelling after fall. Gradually worsening pain the past few days which is described as constant and dull. Today a new onset of pain radiating upwards to her RT hip. Taking Ibuprofen PRN for the pain with some relief.     Review of Systems  Constitutional: Negative.   HENT: Negative.   Eyes: Negative.   Respiratory: Negative.   Cardiovascular: Negative.   Gastrointestinal: Negative.   Genitourinary: Negative.   Musculoskeletal: Positive for falls and myalgias.  Skin: Negative.   Neurological: Negative.   Endo/Heme/Allergies: Negative.   Psychiatric/Behavioral: Negative.     Otherwise per HPI.  HISTORY & PERTINENT PRIOR DATA:  No specialty comments available. She reports that she quit smoking about 60 years ago. She has a 1.25 pack-year smoking history. She has never used smokeless tobacco. No results for input(s): HGBA1C, LABURIC in the last 8760 hours. Medications & Allergies reviewed per EMR Patient Active Problem List   Diagnosis Date Noted  . Acute pain of right knee 10/19/2016  . Acute bilateral low back pain without sciatica 07/02/2016  . Segmental and somatic dysfunction of pelvic region 07/02/2016  . Segmental and somatic dysfunction of lumbar region 07/02/2016  . Precordial pain 05/27/2016  . Fatigue 05/27/2016  . Atypical chest pain 05/27/2016  . Nodule of apex of right lung 05/27/2016  .  Diarrhea 04/06/2016  . Hypothyroidism 11/24/2015  . GERD (gastroesophageal reflux disease) 11/24/2015  . Insomnia 11/24/2015   Past Medical History:  Diagnosis Date  . Colon polyps   . GERD (gastroesophageal reflux disease)   . Migraines   . Osteopenia   . Thyroid disease    Family History  Problem Relation Age of Onset  . Cancer Mother     breast  . Heart disease Father   . Alzheimer's disease Sister   . Heart disease Maternal Grandmother   . Tuberculosis Paternal Grandmother   . Cancer Sister   . Melanoma Sister    Past Surgical History:  Procedure Laterality Date  . KNEE SURGERY Right   . TONSILLECTOMY AND ADENOIDECTOMY     Social History   Occupational History  . Not on file.   Social History Main Topics  . Smoking status: Former Smoker    Packs/day: 0.25    Years: 5.00    Quit date: 05/05/1956  . Smokeless tobacco: Never Used  . Alcohol use 8.4 oz/week    14 Standard drinks or equivalent per week  . Drug use: No  . Sexual activity: Not on file    OBJECTIVE:  VS:  HT:5' 4.5" (163.8 cm)   WT:129 lb (58.5 kg)  BMI:21.8    BP:130/80  HR:(!) 56bpm  TEMP: ( )  RESP:98 % EXAM: Findings:  Elderly female.  No acute distress.  Alert and appropriate with moderate insight. Bilateral lower extremities overall well aligned. Right knee: No significant effusion but small amount of generalized  synovitis.  She has crepitation with varus and valgus strain with 3-4 mm opening with solid endpoint.  There is only minimal pain with this.  She has pain focally over the medial joint line.  No pain with McMurray's.  Anterior posterior drawer normal.  +++++++++++++++++++++++++++++++++++++++++++++++++++++++++++++++++++++++++++++  PROCEDURE NOTE: RIGHT KNEEINJECTION   DESCRIPTION OF PROCEDURE:  The patient's clinical condition is marked by substantial pain and/or significant functional disability. Other conservative therapy has not provided relief, is contraindicated, or  not appropriate. There is a reasonable likelihood that injection will significantly improve the patient's pain and/or functional impairment. After discussing the risks, benefits and expected outcomes of the injection and all questions were reviewed and answered, the patient wished to undergo the above named procedure. Verbal consent was obtained. The target structure was injected under direct visualization using sterile technique as below: PREP: Alcohol, Ethel Chloride APPROACH: anteriomedial,single injection, 22g 1.5" needle INJECTATE: 3cc 1% lidocaine, 1cc 40mg  DepoMedrol DRESSING: Band-Aid  Post procedural instructions including recommending icing and warning signs for infection were reviewed. This procedure was well tolerated and there were no complications.         No results found. ASSESSMENT & PLAN:  Visit Diagnoses:  1. Acute pain of right knee    Problem List Items Addressed This Visit    Acute pain of right knee    3-1 injection provided today. Continue with icing as needed. If any lack of improvement follow-up for reevaluation Continue therapeutic exercises previously prescribed for her low back.      Relevant Orders   DG Knee Complete 4 Views Right   US LIMITED JOINT SPACE STRUCTURES LOW RIGHT(NO LINKED CHARGES)     Meds: No orders of the defined types were placed in this encounter.  Follow-up: Return if symptoms worsen or fail to improve.   CMA/ATC served as Neurosurgeonscribe during this visit. History, Physical, and Plan performed by medical provider. Documentation and orders reviewed and attested to.      Gaspar BiddingMichael Daryon Remmert, DO    [facility] Physician    10/19/2016 11:46 AM

## 2016-10-19 NOTE — Assessment & Plan Note (Signed)
3-1 injection provided today. Continue with icing as needed. If any lack of improvement follow-up for reevaluation Continue therapeutic exercises previously prescribed for her low back.

## 2016-10-21 ENCOUNTER — Telehealth: Payer: Self-pay | Admitting: Internal Medicine

## 2016-10-21 NOTE — Telephone Encounter (Signed)
Please call and follow-up with the patient.  We did discuss that she can occasionally have pain the following day after the injection this should gradually diminish.  As long she is not having any fevers, chills or significant swelling around the area of the injection I would like for her to ice the area and try to stay off her feet over the weekend

## 2016-10-21 NOTE — Telephone Encounter (Signed)
Patient came into office, She states [facility] Her knee is hurting worse now she states. She unsure what to do about it at this point. She states Dr. Berline Choughigby informed her to let him know if she had any issues. Can your office please follow up with patient. Thank you.

## 2016-10-21 NOTE — Telephone Encounter (Signed)
Called patient to inform. Dr. Berline Choughigby had already spoke with patient. Thank you.

## 2016-11-13 ENCOUNTER — Encounter (HOSPITAL_COMMUNITY): Payer: Self-pay | Admitting: *Deleted

## 2016-11-13 ENCOUNTER — Ambulatory Visit (HOSPITAL_COMMUNITY)
Admission: EM | Admit: 2016-11-13 | Discharge: 2016-11-13 | Disposition: A | Discharge status: Home / Self Care | Payer: Medicare Other | Attending: Internal Medicine | Admitting: Internal Medicine

## 2016-11-13 DIAGNOSIS — R21 Rash and other nonspecific skin eruption: Secondary | ICD-10-CM

## 2016-11-13 DIAGNOSIS — I10 Essential (primary) hypertension: Secondary | ICD-10-CM

## 2016-11-13 MED ORDER — TRIAMCINOLONE ACETONIDE 0.1 % EX CREA: 1.0000 "application " | TOPICAL_CREAM | Freq: Two times a day (BID) | CUTANEOUS | 1 refills | Status: DC

## 2016-11-13 NOTE — ED Triage Notes (Signed)
C/O pruritis to various areas of body x few days.  Today noticed rash to face and bilat lower legs.

## 2016-11-13 NOTE — Discharge Instructions (Signed)
Suggest you use Zyrtec or Claritin daily for itching and rash. May use the cream to your legs twice a day for up to 2 weeks. If you use on your face make it a very light application. If you are not improving in the next week then please f/u with Dermatology. F/U on your blood pressure as well.

## 2016-11-13 NOTE — ED Provider Notes (Signed)
CSN: [id number]     Arrival date & time 11/13/16  1535 History   First MD Initiated Contact with Patient 11/13/16 1554     Chief Complaint  Patient presents with  . Rash   (Consider location/radiation/quality/duration/timing/severity/associated sxs/prior Treatment)  83 yo caucasian female presents with a rash to her lower legs for a few days and a facial rash since this am. She is otherwise feeling well without dyspnea our throat swelling. She was working in the yard for a few days prior to this happening. No new medications and no known exposures as noted.       Past Medical History:  Diagnosis Date  . Colon polyps   . GERD (gastroesophageal reflux disease)   . Migraines   . Osteopenia   . Thyroid disease    Past Surgical History:  Procedure Laterality Date  . KNEE SURGERY Right   . TONSILLECTOMY AND ADENOIDECTOMY     Family History  Problem Relation Age of Onset  . Cancer Mother        breast  . Heart disease Father   . Alzheimer's disease Sister   . Heart disease Maternal Grandmother   . Tuberculosis Paternal Grandmother   . Cancer Sister   . Melanoma Sister    Social History  Substance Use Topics  . Smoking status: Former Smoker    Packs/day: 0.25    Years: 5.00    Quit date: 05/05/1956  . Smokeless tobacco: Never Used  . Alcohol use 8.4 oz/week    14 Standard drinks or equivalent per week   OB History    No data available     Review of Systems  Respiratory: Negative for shortness of breath.   Cardiovascular: Negative for chest pain.  Psychiatric/Behavioral: Negative.   All other systems reviewed and are negative.   Allergies  Patient has no known allergies.  Home Medications   Prior to Admission medications   Medication Sig Start Date End Date Taking? Authorizing Provider  levothyroxine (SYNTHROID, LEVOTHROID) 75 MCG tablet Take 1 tablet (75 mcg total) by mouth daily before breakfast. 08/25/16  Yes Myrlene Brokerrawford, Elizabeth A, MD  mirtazapine (REMERON)  7.5 MG tablet Take 1 tablet (7.5 mg total) by mouth at bedtime. 08/27/16  Yes Myrlene Brokerrawford, Elizabeth A, MD  omeprazole (PRILOSEC) 20 MG capsule Take 1 capsule (20 mg total) by mouth daily. 08/27/16  Yes Myrlene Brokerrawford, Elizabeth A, MD  fluorouracil (EFUDEX) 5 % cream  05/21/16   [provider]  triamcinolone cream (KENALOG) 0.1 % Apply 1 application topically 2 (two) times daily. 11/13/16   Riki SheerYoung, Grover Woodfield G, PA-C   Meds Ordered and Administered this Visit  Medications - No data to display  BP (!) 163/92   Pulse (!) 59   Temp 97.7 F (36.5 C) (Oral)   Resp 18   SpO2 100%  No data found.   Physical Exam  Constitutional: She appears well-developed and well-nourished. No distress.  HENT:  Mouth/Throat: Oropharynx is clear and moist.  Pulmonary/Chest: Effort normal.  Skin: Skin is warm and dry. Rash noted. She is not diaphoretic.  Erythematous rash to bilateral lower legs and mild erythema to cheeks. No urticaria is noted, no warmth  Psychiatric: Her behavior is normal.  Nursing note and vitals reviewed.   Urgent Care Course     Procedures (including critical care time)  Labs Review Labs Reviewed - No data to display  Imaging Review No results found.   Visual Acuity Review  Right Eye Distance:   Left  Eye Distance:   Bilateral Distance:    Right Eye Near:   Left Eye Near:    Bilateral Near:         MDM   1. Rash   2. Essential hypertension    1. Etiology unclear though appears to be a dermatitis presentation. Treat with anti-histamine daily for itching and treat with low potency steroid cream to legs, ok to use minimally on the face if needed. Wash with gentle soap and keep off fragranced lotions or creams. If worsens f/u with your dermatologist.  2. F/U with your PCP regarding elevated BP-suspect white coat syndrome as patient was nervous    Riki Sheer, PA-C 11/13/16 1629

## 2016-11-16 ENCOUNTER — Ambulatory Visit (INDEPENDENT_AMBULATORY_CARE_PROVIDER_SITE_OTHER): Payer: Medicare Other | Admitting: Internal Medicine

## 2016-11-16 ENCOUNTER — Encounter: Payer: Self-pay | Admitting: Internal Medicine

## 2016-11-16 VITALS — BP 164/90 | HR 64 | Ht 65.0 in | Wt 131.0 lb

## 2016-11-16 DIAGNOSIS — R030 Elevated blood-pressure reading, without diagnosis of hypertension: Secondary | ICD-10-CM | POA: Diagnosis not present

## 2016-11-16 DIAGNOSIS — R21 Rash and other nonspecific skin eruption: Secondary | ICD-10-CM

## 2016-11-16 DIAGNOSIS — K219 Gastro-esophageal reflux disease without esophagitis: Secondary | ICD-10-CM | POA: Diagnosis not present

## 2016-11-16 MED ORDER — METHYLPREDNISOLONE 4 MG PO TBPK: ORAL_TABLET | ORAL | 0 refills | Status: DC

## 2016-11-16 MED ORDER — METHYLPREDNISOLONE ACETATE 80 MG/ML IJ SUSP
80.0000 mg | Freq: Once | INTRAMUSCULAR | Status: AC
  Administered 2016-11-16: 80 mg via INTRAMUSCULAR

## 2016-11-16 NOTE — Patient Instructions (Addendum)
You had the steroid shot today  Please take all new medication as prescribed - the Medrol (steroid) pack, as well as Zyrten 10 mg per day OR Benadryl 25-50 mg every 6 hrs as needed, AND consider taking Zantac 150 mg daily as well which can help with the rash and itching  Please check your Blood Pressure in 1 week, and call or return if you still have the high blood pressure, as this may need treatment as well  Please continue all other medications as before, and refills have been done if requested.  Please have the pharmacy call with any other refills you may need.  Please keep your appointments with your specialists as you may have planned

## 2016-11-16 NOTE — Progress Notes (Signed)
Subjective:    Patient ID: Rachel ParkinsJulia Krahn, female    DOB: 03/18/1931, 83 y.o.   MRN: [id number]  HPI  Here with son who helps with hx; I am seeing pt for first time, no hx of dementia on record but seems at least somewhat overwhelmed cognitively and nervous by her predicament, and having some difficulty with comprehension of events and their meaning; we are able to determine through both son and pt that pt has had a significant recent 1 wk hx of pruritic rash; seemed to start with legs and was seen at ED 6/2, with tx with antihistamine, triam cr prn, gentle soaps and avoid contact chemicals.  Since then rash has become worse, steroid cream not working for itch or rash, with rash now to all extremities and torso diffusely, and evidence for bilat maxillary edema with erythema, nontender.  Denies sinus congestion, fever, HA or pain, Denies ST, cough , throat swelling and Pt denies chest pain, increased sob or doe, wheezing, orthopnea, PND, increased LE swelling, palpitations, dizziness or syncope.  Also at same time as rash is nervousness and finding of persistent mild to mod elevated BP  No hx of significant allergies or allergic rash. No recent med changes, and not on ACE Wt Readings from Last 3 Encounters:  11/16/16 131 lb (59.4 kg)  10/19/16 129 lb (58.5 kg)  07/30/16 127 lb (57.6 kg)   BP Readings from Last 3 Encounters:  11/16/16 (!) 164/90  11/13/16 (!) 163/92  10/19/16 130/80   Past Medical History:  Diagnosis Date  . Colon polyps   . GERD (gastroesophageal reflux disease)   . Migraines   . Osteopenia   . Thyroid disease    Past Surgical History:  Procedure Laterality Date  . KNEE SURGERY Right   . TONSILLECTOMY AND ADENOIDECTOMY      reports that she quit smoking about 60 years ago. She has a 1.25 pack-year smoking history. She has never used smokeless tobacco. She reports that she drinks about 8.4 oz of alcohol per week . She reports that she does not use drugs. family history  includes Alzheimer's disease in her sister; Cancer in her mother and sister; Heart disease in her father and maternal grandmother; Melanoma in her sister; Tuberculosis in her paternal grandmother. No Known Allergies Current Outpatient Prescriptions on File Prior to Visit  Medication Sig Dispense Refill  . fluorouracil (EFUDEX) 5 % cream     . levothyroxine (SYNTHROID, LEVOTHROID) 75 MCG tablet Take 1 tablet (75 mcg total) by mouth daily before breakfast. 90 tablet 1  . mirtazapine (REMERON) 7.5 MG tablet Take 1 tablet (7.5 mg total) by mouth at bedtime. 30 tablet 6  . omeprazole (PRILOSEC) 20 MG capsule Take 1 capsule (20 mg total) by mouth daily. 90 capsule 3  . triamcinolone cream (KENALOG) 0.1 % Apply 1 application topically 2 (two) times daily. 30 g 1   No current facility-administered medications on file prior to visit.    Review of Systems  Constitutional: Negative for other unusual diaphoresis or sweats HENT: Negative for ear discharge or swelling Eyes: Negative for other worsening visual disturbances Respiratory: Negative for stridor or other swelling  Gastrointestinal: Negative for worsening distension or other blood Genitourinary: Negative for retention or other urinary change Musculoskeletal: Negative for other MSK pain or swelling Skin: Negative for color change or other new lesions Neurological: Negative for worsening tremors and other numbness  Psychiatric/Behavioral: Negative for worsening agitation or other fatigue All other system neg per pt  Objective:   Physical Exam BP (!) 164/90   Pulse 64   Ht 5\' 5"  (1.651 m)   Wt 131 lb (59.4 kg)   SpO2 99%   BMI 21.80 kg/m  VS noted, non toxic Constitutional: Pt appears in NAD HENT: Head: NCAT.  Right Ear: External ear normal.  Left Ear: External ear normal.  Eyes: . Pupils are equal, round, and reactive to light. Conjunctivae and EOM are normal Nose: without d/c or deformity Neck: Neck supple. Gross normal  ROM Cardiovascular: Normal rate and regular rhythm.   Pulmonary/Chest: Effort normal and breath sounds without rales or wheezing.  Neurological: Pt is alert. At baseline orientation, motor grossly intact Skin: Skin is warm, no LE edema; currently with diffuse TNTC < 5 mm erythematous raised lesions to torso and extremities, as well as bilat maxillary facies with nontender erythem swelling, but no tongue or throat swelling noted  Psychiatric: Pt behavior is normal without agitation , but 2-3+ nervous All other system neg per pt    Assessment & Plan:

## 2016-11-17 ENCOUNTER — Telehealth: Payer: Self-pay | Admitting: Internal Medicine

## 2016-11-17 DIAGNOSIS — Z9109 Other allergy status, other than to drugs and biological substances: Secondary | ICD-10-CM

## 2016-11-17 NOTE — Telephone Encounter (Signed)
Spoke with the patient and she stated that she would like an allergy test to see what all she is allergic to. I offered to go blood work for lyme disease or [place] spotted fever if she was concerned about a tick bite. She stated that nothing would show on there but I explained until we do the test we wouldn't know if it was an issue there. She asked for an appt with an allergy specialist but I had to explain to her that we don't have one here at this office but she is more than welcome to find a specialist if she is concerned about allergies. She asked for names but I couldn't suggest anyone cause we don't have a list of all providers and their specialties since [facility] and suggested that she speaks with some family to help her find one that could benefit her needs. She expressed understanding.

## 2016-11-17 NOTE — Telephone Encounter (Signed)
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  She would like blood work done to rule out that she was bitten by a tick and is allergic to meat, I asked if she has eaten any beef or pork and she states she has not in awhile so she does not know if she has any symptoms if she eats it.

## 2016-11-17 NOTE — Telephone Encounter (Signed)
No  Problem, I will do the allergy referral, there are several allergists in town, and it just depends on her insurance.

## 2016-11-17 NOTE — Addendum Note (Signed)
Addended by: Corwin LevinsJOHN, Camrin Lapre W on: 11/17/2016 08:21 PM   Modules accepted: Orders

## 2016-11-18 NOTE — Telephone Encounter (Signed)
Pt was informed and expressed understanding.  

## 2016-11-20 DIAGNOSIS — I10 Essential (primary) hypertension: Secondary | ICD-10-CM | POA: Insufficient documentation

## 2016-11-20 DIAGNOSIS — R21 Rash and other nonspecific skin eruption: Secondary | ICD-10-CM | POA: Insufficient documentation

## 2016-11-20 NOTE — Assessment & Plan Note (Signed)
C/w evolving hypersensitivity or allergic rash, etiology unknown, with current antihist and topical steroid tx not effective;  D/w pt and son differential dx and tx to include depomedrol 80 IM, predpac asd, cont antihist with zyrtec +/- zantac as well,  to f/u any worsening symptoms or concerns

## 2016-11-20 NOTE — Assessment & Plan Note (Signed)
Ok to continue prilosec for now even though taking the zantac,  to f/u any worsening symptoms or concerns

## 2016-11-20 NOTE — Assessment & Plan Note (Signed)
Current onset coincides with rash onset, suspect hyperdynamism due to illness vs white coat element; would Hold on specific med tx at this time as likely to resolve with tx above, to monitor BP at home and next visit, to call in 5 days if still uncontrolled at home

## 2016-11-25 ENCOUNTER — Telehealth: Payer: Self-pay | Admitting: Internal Medicine

## 2016-11-25 NOTE — Telephone Encounter (Signed)
[facility] - Client TELEPHONE ADVICE RECORD [facility]  Patient Name: Rachel Ferguson  DOB: 03/07/1931    Initial Comment Caller says , has taken Prednisone Rx, whish she finished on Sunday, she was dizzy while taking the Rx, and has been dizzy for several days sicne then.    Nurse Assessment  Nurse: Rachel LusterBowers, RN, Rachel Ferguson Date/Time (Eastern Time): 11/25/2016 11:13:12 AM  Confirm and document reason for call. If symptomatic, describe symptoms. ---Caller says , has taken Prednisone Rx, whish she finished on Sunday (taking for rash and elevated BP), she was dizzy while taking the Rx, and has been dizzy for several days since then.  Does the patient have any new or worsening symptoms? ---Yes  Will a triage be completed? ---Yes  Related visit to physician within the last 2 weeks? ---No  Does the PT have any chronic conditions? (i.e. diabetes, asthma, etc.) ---Yes  Is this a behavioral health or substance abuse call? ---No     Guidelines    Guideline Title Affirmed Question Affirmed Notes  Dizziness - Lightheadedness [1] MODERATE dizziness (e.g., interferes with normal activities) AND [2] has NOT been evaluated by physician for this (Exception: dizziness caused by heat exposure, sudden standing, or poor fluid intake)    Final Disposition User   See Physician within 24 Hours Rachel LusterBowers, RN, Rachel Ferguson    Comments  Scheduled appt for tomorrow, with Dr. Jonny RuizJohn at 11am at the [place] office.   Referrals  REFERRED TO PCP OFFICE   Disagree/Comply: Comply

## 2016-11-26 ENCOUNTER — Ambulatory Visit (INDEPENDENT_AMBULATORY_CARE_PROVIDER_SITE_OTHER): Payer: Medicare Other | Admitting: Internal Medicine

## 2016-11-26 ENCOUNTER — Other Ambulatory Visit (INDEPENDENT_AMBULATORY_CARE_PROVIDER_SITE_OTHER): Payer: Medicare Other

## 2016-11-26 ENCOUNTER — Encounter: Payer: Self-pay | Admitting: Internal Medicine

## 2016-11-26 VITALS — BP 168/100 | HR 67 | Ht 65.0 in | Wt 127.0 lb

## 2016-11-26 DIAGNOSIS — R4789 Other speech disturbances: Secondary | ICD-10-CM

## 2016-11-26 DIAGNOSIS — R42 Dizziness and giddiness: Secondary | ICD-10-CM

## 2016-11-26 DIAGNOSIS — L84 Corns and callosities: Secondary | ICD-10-CM | POA: Diagnosis not present

## 2016-11-26 DIAGNOSIS — I10 Essential (primary) hypertension: Secondary | ICD-10-CM

## 2016-11-26 LAB — HEPATIC FUNCTION PANEL
ALBUMIN: 4.2 g/dL (ref 3.5–5.2)
ALT: 11 U/L (ref 0–35)
AST: 17 U/L (ref 0–37)
Alkaline Phosphatase: 70 U/L (ref 39–117)
Bilirubin, Direct: 0.1 mg/dL (ref 0.0–0.3)
TOTAL PROTEIN: 6.9 g/dL (ref 6.0–8.3)
Total Bilirubin: 0.5 mg/dL (ref 0.2–1.2)

## 2016-11-26 LAB — T4, FREE: FREE T4: 1.02 ng/dL (ref 0.60–1.60)

## 2016-11-26 LAB — BASIC METABOLIC PANEL
BUN: 19 mg/dL (ref 6–23)
CHLORIDE: 100 meq/L (ref 96–112)
CO2: 30 meq/L (ref 19–32)
Calcium: 9.5 mg/dL (ref 8.4–10.5)
Creatinine, Ser: 0.86 mg/dL (ref 0.40–1.20)
GFR: 66.76 mL/min (ref >60.00)
GLUCOSE: 85 mg/dL (ref 70–99)
Potassium: 4.9 mEq/L (ref 3.5–5.1)
Sodium: 136 mEq/L (ref 135–145)

## 2016-11-26 LAB — CBC WITH DIFFERENTIAL/PLATELET
BASOS ABS: 0 10*3/uL (ref 0.0–0.1)
BASOS PCT: 0.7 % (ref 0.0–3.0)
EOS ABS: 0 10*3/uL (ref 0.0–0.7)
Eosinophils Relative: 0.7 % (ref 0.0–5.0)
HEMATOCRIT: 41 % (ref 36.0–46.0)
HEMOGLOBIN: 13.8 g/dL (ref 12.0–15.0)
Lymphocytes Relative: 24.7 % (ref 12.0–46.0)
Lymphs Abs: 1.6 10*3/uL (ref 0.7–4.0)
MCHC: 33.8 g/dL (ref 30.0–36.0)
MCV: 95.1 fl (ref 78.0–100.0)
MONO ABS: 0.8 10*3/uL (ref 0.1–1.0)
Monocytes Relative: 11.6 % (ref 3.0–12.0)
Neutro Abs: 4.1 10*3/uL (ref 1.4–7.7)
Neutrophils Relative %: 62.3 % (ref 43.0–77.0)
Platelets: 218 10*3/uL (ref 150.0–400.0)
RBC: 4.32 Mil/uL (ref 3.87–5.11)
RDW: 12.5 % (ref 11.5–15.5)
WBC: 6.5 10*3/uL (ref 4.0–10.5)

## 2016-11-26 LAB — URINALYSIS, ROUTINE W REFLEX MICROSCOPIC
Bilirubin Urine: NEGATIVE
HGB URINE DIPSTICK: NEGATIVE
KETONES UR: NEGATIVE
NITRITE: NEGATIVE
RBC / HPF: NONE SEEN (ref 0–?)
Specific Gravity, Urine: 1.01 (ref 1.000–1.030)
TOTAL PROTEIN, URINE-UPE24: NEGATIVE
URINE GLUCOSE: NEGATIVE
Urobilinogen, UA: 0.2 (ref 0.0–1.0)
pH: 7.5 (ref 5.0–8.0)

## 2016-11-26 LAB — TSH: TSH: 3.12 u[IU]/mL (ref 0.35–4.50)

## 2016-11-26 LAB — VITAMIN B12: Vitamin B-12: 281 pg/mL (ref 211–911)

## 2016-11-26 MED ORDER — AMLODIPINE BESYLATE 5 MG PO TABS: 5.0000 mg | ORAL_TABLET | Freq: Every day | ORAL | 3 refills | Status: DC

## 2016-11-26 NOTE — Progress Notes (Signed)
Subjective:    Patient ID: Rachel Ferguson, female    DOB: 02/22/1931, 83 y.o.   MRN: [id number]  HPI    Here with 1 wk dizziness, lightheaded type, intermittent , moderate,  lying down with cloth makes better.  Worse in last few days, no syncope.  Nothing else seems to help.  No change with position and no vertigo like dizziness.   Pt denies chest pain, increased sob or doe, wheezing, orthopnea, PND, increased LE swelling, palpitations, dizziness or syncope.  Pt denies fever, wt loss, night sweats, loss of appetite, or other constitutional symptoms Wt Readings from Last 3 Encounters:  11/26/16 127 lb (57.6 kg)  11/16/16 131 lb (59.4 kg)  10/19/16 129 lb (58.5 kg)  Denies worsening reflux, abd pain, dysphagia, n/v, bowel change or blood, except for slight nausea yesterday.  Has some word finding difficulty and has felt intermittent confusion.  Pt denies new neurological symptoms such as new headache, or facial or extremity weakness or numbness   Has hx of hypothyroidism, asks for f/u labs. Last TFT dec 2017 [place]. Denies hyper or hypo thyroid symptoms such as voice, skin or hair change.  Denies urinary symptoms such as dysuria, frequency, urgency, flank pain, hematuria or n/v, fever, chills.  No overt bleeding.  No signifcant allergic sinus or ear symptoms, No further rash.  BP remains somewhat elevated today Past Medical History:  Diagnosis Date  . Colon polyps   . GERD (gastroesophageal reflux disease)   . Migraines   . Osteopenia   . Thyroid disease    Past Surgical History:  Procedure Laterality Date  . KNEE SURGERY Right   . TONSILLECTOMY AND ADENOIDECTOMY      reports that she quit smoking about 60 years ago. She has a 1.25 pack-year smoking history. She has never used smokeless tobacco. She reports that she drinks about 8.4 oz of alcohol per week . She reports that she does not use drugs. family history includes Alzheimer's disease in her sister; Cancer in her mother and sister; Heart  disease in her father and maternal grandmother; Melanoma in her sister; Tuberculosis in her paternal grandmother. No Known Allergies Current Outpatient Prescriptions on File Prior to Visit  Medication Sig Dispense Refill  . fluorouracil (EFUDEX) 5 % cream     . levothyroxine (SYNTHROID, LEVOTHROID) 75 MCG tablet Take 1 tablet (75 mcg total) by mouth daily before breakfast. 90 tablet 1  . methylPREDNISolone (MEDROL DOSEPAK) 4 MG TBPK tablet Use as directed 21 tablet 0  . mirtazapine (REMERON) 7.5 MG tablet Take 1 tablet (7.5 mg total) by mouth at bedtime. 30 tablet 6  . omeprazole (PRILOSEC) 20 MG capsule Take 1 capsule (20 mg total) by mouth daily. 90 capsule 3  . triamcinolone cream (KENALOG) 0.1 % Apply 1 application topically 2 (two) times daily. 30 g 1   No current facility-administered medications on file prior to visit.    Review of Systems  Constitutional: Negative for other unusual diaphoresis or sweats HENT: Negative for ear discharge or swelling Eyes: Negative for other worsening visual disturbances Respiratory: Negative for stridor or other swelling  Gastrointestinal: Negative for worsening distension or other blood Genitourinary: Negative for retention or other urinary change Musculoskeletal: Negative for other MSK pain or swelling Skin: Negative for color change or other new lesions Neurological: Negative for worsening tremors and other numbness  Psychiatric/Behavioral: Negative for worsening agitation or other fatigue All other system neg per pt    Objective:   Physical Exam BP Marland Kitchen(!)  150/84   Pulse 62   Ht 5\' 5"  (1.651 m)   Wt 127 lb (57.6 kg)   SpO2 99%   BMI 21.13 kg/m   Orthostatics - lying 152/98, sitting 164/100, standing 168/100 with pulse 67, assoc with dizziness VS noted, not ill appaering Constitutional: Pt appears in NAD HENT: Head: NCAT.  Right Ear: External ear normal.  Left Ear: External ear normal.  Eyes: . Pupils are equal, round, and reactive to  light. Conjunctivae and EOM are normal Nose: without d/c or deformity Neck: Neck supple. Gross normal ROM Cardiovascular: Normal rate and regular rhythm.   Pulmonary/Chest: Effort normal and breath sounds without rales or wheezing.  Abd:  Soft, NT, ND, + BS, no organomegaly Neurological: Pt is alert. At baseline orientation, motor grossly intact Skin: Skin is warm. No rashes, other new lesions, no LE edema Psychiatric: Pt behavior is normal without agitation but 2+ nervous, verbose and talkative No other exam findings    Assessment & Plan:

## 2016-11-26 NOTE — Patient Instructions (Addendum)
Your Blood Pressure was OK today with standing  Please take all new medication as prescribed - the mild low dose blood pressure medication - amlodiipine 5 mg ([facility] but ALSO given in hardcopy for local phamacy to start sooner)  Please continue all other medications as before, and refills have been done if requested.  Please have the pharmacy call with any other refills you may need.  Please keep your appointments with your specialists as you may have planned  You will be contacted regarding the referral for: Carotid ultrasound, and Head MRI, and podiatry  Please go to the LAB in the Basement (turn left off the elevator) for the tests to be done today  You will be contacted by phone if any changes need to be made immediately.  Otherwise, you will receive a letter about your results with an explanation, but please check with MyChart first.  Please remember to sign up for MyChart if you have not done so, as this will be important to you in the future with finding out test results, communicating by private email, and scheduling acute appointments online when needed.

## 2016-11-27 DIAGNOSIS — L84 Corns and callosities: Secondary | ICD-10-CM | POA: Insufficient documentation

## 2016-11-27 NOTE — Assessment & Plan Note (Signed)
For podiatry referral 

## 2016-11-27 NOTE — Assessment & Plan Note (Signed)
I suspect has at least mild cognitive impairment, pt states problem is new, for MRI head given symptoms and dizziness - r/o cva

## 2016-11-27 NOTE — Assessment & Plan Note (Addendum)
Etiology unclear, has evident dizziness with position change, exam o/w benign, not orthostatic, allergy symptoms resolved, for meclizine prn, Head MRI and carotid exam  Note:  Total time for pt hx, exam, review of record with pt in the room, determination of diagnoses and plan for further eval and tx is > 40 min, with over 50% spent in coordination and counseling of patient, including the differential dx, eval and tx of dizziness, word finding difficulty/memory issues, new onset HTN and foot corn

## 2016-11-27 NOTE — Assessment & Plan Note (Addendum)
Persistent elevated, for amlodipine 5 mg, o/w stable overall by history and exam, recent data reviewed with pt, and pt to continue medical treatment as before,  to f/u any worsening symptoms or concerns BP Readings from Last 3 Encounters:  11/26/16 (!) 168/100  11/16/16 (!) 164/90  11/13/16 (!) 163/92

## 2016-11-30 ENCOUNTER — Telehealth: Payer: Self-pay

## 2016-11-30 ENCOUNTER — Telehealth: Payer: Self-pay | Admitting: Internal Medicine

## 2016-11-30 NOTE — Telephone Encounter (Signed)
Pt stated that she has called already and has made an appt there. Her sisters are driving her the 4 hour round trip there and back. She stated that we did not know what we were talking about needing a referral in a week and stated she got an appt in a matter of 4 minutes. I informed her of the referral process from our office and said if she called and made an appt then that's what she was going to get but coming from us a referral would be needed. She said that was stupid and doesn't make sense. I told her I'm sorry she feels that way but that's the referral process.   FYI: I'm not sure if her insurance is going to cover it or not since she called and make the appt herself. So idk if we need to do anything further on our end.

## 2016-11-30 NOTE — Telephone Encounter (Signed)
I have made the appt for the patient and the pt is aware and taken care of

## 2016-11-30 NOTE — Telephone Encounter (Signed)
Called pt, LVM.   

## 2016-11-30 NOTE — Telephone Encounter (Signed)
Pt called regarding her referral for her MRI, she told me [facility] cannot see her for 9 days for her MRI, she does not want to wait 9 days because she is dizzy. She has called around and she found an appointment for tomorrow at [facility], she wants a new referral put in so she can get the appointment tomorrow, I told her that a referral is a process and I cannot guarantee she will get the appointment tomorrow, she said that is stupid and told me to have the nurse call her. I have called Lawson FiscalLori and she is going to put in the new referral but she can neither guarantee she can get it and is unsure about her insurance.   She doesn't understand why Dr. Jonny RuizJohn cant call and get her an appointment so she would like a call back.

## 2016-11-30 NOTE — Telephone Encounter (Signed)
Sorry, no I am not able to do this at this time; pt can speak to allergist about this at next appt

## 2016-11-30 NOTE — Telephone Encounter (Signed)
Pt wants you to order a lab called "[facility]" for allergy testing even though Lawson FiscalLori and I have worked to get her in to see an allergist this coming Monday. Would you like to order this for the patient or let her see the allergist and let them handle what labs they would like to order? Please advise.

## 2016-12-01 NOTE — Telephone Encounter (Signed)
Patient called in.  I gave her MD response.

## 2016-12-01 NOTE — Telephone Encounter (Signed)
I just spoke with pt and informed her that we do not have a program here that would allow us to read what is on the disc and that we would have to wait for the report to be faxed over. I suggested she could come get the disc if she would like but she declined and stated that it would not be of any use to her. I told her we would be in touch once the report comes.

## 2016-12-01 NOTE — Telephone Encounter (Signed)
Patient came by office. She wanted to make sure we told Dr. Jonny RuizJohn she completed her MRI and he would call her with the results. Patient was informed we would call and speak with her about the results.

## 2016-12-02 ENCOUNTER — Telehealth: Payer: Self-pay | Admitting: Internal Medicine

## 2016-12-02 NOTE — Telephone Encounter (Signed)
Pt has memory dysfxn  Will let know of MRI results when available

## 2016-12-02 NOTE — Telephone Encounter (Signed)
Pt called and wants to know her results ASAP! She is upset because she has not heard anything yet.  I explain to her they were just done yesterday and Dr Jonny Ruizjohn would have to read them.

## 2016-12-02 NOTE — Telephone Encounter (Signed)
Patient states phone number to [facility] is ph# [phone].  Is requesting our office to call to get the MRI report.  States this was suppose to be sent "electronically" to our office.  I have called the imaging department.  They are going to fax the report over to me.

## 2016-12-02 NOTE — Telephone Encounter (Signed)
Noted  

## 2016-12-02 NOTE — Telephone Encounter (Signed)
Called patient.  Notified her that we did get the MRI report.  Told her that Dr. Jonny RuizJohn is reviewing the report and that Shirron will give her a call back with those results this evening.

## 2016-12-02 NOTE — Telephone Encounter (Signed)
Pt is upset that we have not received the report and does not recall the conversation that we had yesterday when I informed her that we had received the disc she had dropped off and that we would have to wait for the report to be faxed over because we don't have a program capable of reading it. She then proceed to state that I should apologize to her for not calling yesterday to let her know and I restated that we did discuss that yesterday and an apology was not necessary but I did apologize that we have not received the report yet. I let her know that she could have the provider who did her MRI to refax the report over to us and I'm sure that they wouldn't have an issue with faxing it over and I would look out for it. Once again the conversation repeated and I said if she liked I could call her son and explain the situation so he can be informed of where we are at in this process (I wasn't sure if she was understanding the situation at this point) and that I was happy to do so if she would like. She declined and at this point she is talking over me, yelling and upset about something we have reviewed multiple times and unfortunately I have no control over. I politely stated she could call them and have them fax it over once more and that I have to get back to clinic.   FYI: Sharman CheekG. Calone can vouch that we had a conversation about this yesterday because he overheard me talking with her and recall that same conversation today.

## 2016-12-02 NOTE — Telephone Encounter (Signed)
Pt has been informed of MRI results, per JJ "MRI was negative for any acute findings, no tumors, stroke, etc. She expressed understanding.   --[facility] to [facility].

## 2016-12-03 ENCOUNTER — Ambulatory Visit (HOSPITAL_COMMUNITY): Payer: Medicare Other

## 2016-12-03 ENCOUNTER — Observation Stay (HOSPITAL_COMMUNITY)
Admission: EM | Admit: 2016-12-03 | Discharge: 2016-12-04 | Disposition: A | Discharge status: Home / Self Care | Payer: Medicare Other | Attending: Internal Medicine | Admitting: Internal Medicine

## 2016-12-03 ENCOUNTER — Emergency Department (HOSPITAL_COMMUNITY): Payer: Medicare Other

## 2016-12-03 ENCOUNTER — Encounter (HOSPITAL_COMMUNITY): Payer: Self-pay | Admitting: Nurse Practitioner

## 2016-12-03 DIAGNOSIS — Z79899 Other long term (current) drug therapy: Secondary | ICD-10-CM | POA: Diagnosis not present

## 2016-12-03 DIAGNOSIS — G4700 Insomnia, unspecified: Secondary | ICD-10-CM | POA: Insufficient documentation

## 2016-12-03 DIAGNOSIS — R55 Syncope and collapse: Secondary | ICD-10-CM

## 2016-12-03 DIAGNOSIS — Z836 Family history of other diseases of the respiratory system: Secondary | ICD-10-CM | POA: Diagnosis not present

## 2016-12-03 DIAGNOSIS — R000 Tachycardia, unspecified: Principal | ICD-10-CM | POA: Diagnosis present

## 2016-12-03 DIAGNOSIS — Z87891 Personal history of nicotine dependence: Secondary | ICD-10-CM | POA: Insufficient documentation

## 2016-12-03 DIAGNOSIS — F329 Major depressive disorder, single episode, unspecified: Secondary | ICD-10-CM | POA: Diagnosis not present

## 2016-12-03 DIAGNOSIS — I371 Nonrheumatic pulmonary valve insufficiency: Secondary | ICD-10-CM | POA: Insufficient documentation

## 2016-12-03 DIAGNOSIS — I10 Essential (primary) hypertension: Secondary | ICD-10-CM | POA: Diagnosis not present

## 2016-12-03 DIAGNOSIS — Z8249 Family history of ischemic heart disease and other diseases of the circulatory system: Secondary | ICD-10-CM | POA: Diagnosis not present

## 2016-12-03 DIAGNOSIS — G43909 Migraine, unspecified, not intractable, without status migrainosus: Secondary | ICD-10-CM | POA: Insufficient documentation

## 2016-12-03 DIAGNOSIS — R42 Dizziness and giddiness: Secondary | ICD-10-CM

## 2016-12-03 DIAGNOSIS — Z820 Family history of epilepsy and other diseases of the nervous system: Secondary | ICD-10-CM | POA: Diagnosis not present

## 2016-12-03 DIAGNOSIS — K219 Gastro-esophageal reflux disease without esophagitis: Secondary | ICD-10-CM | POA: Insufficient documentation

## 2016-12-03 DIAGNOSIS — M8580 Other specified disorders of bone density and structure, unspecified site: Secondary | ICD-10-CM | POA: Diagnosis not present

## 2016-12-03 DIAGNOSIS — Z808 Family history of malignant neoplasm of other organs or systems: Secondary | ICD-10-CM | POA: Insufficient documentation

## 2016-12-03 DIAGNOSIS — M9903 Segmental and somatic dysfunction of lumbar region: Secondary | ICD-10-CM | POA: Insufficient documentation

## 2016-12-03 DIAGNOSIS — E039 Hypothyroidism, unspecified: Secondary | ICD-10-CM | POA: Diagnosis not present

## 2016-12-03 DIAGNOSIS — Z86010 Personal history of colonic polyps: Secondary | ICD-10-CM | POA: Insufficient documentation

## 2016-12-03 DIAGNOSIS — M9905 Segmental and somatic dysfunction of pelvic region: Secondary | ICD-10-CM | POA: Insufficient documentation

## 2016-12-03 LAB — I-STAT TROPONIN, ED: Troponin i, poc: 0 ng/mL (ref 0.00–0.08)

## 2016-12-03 LAB — CBG MONITORING, ED: GLUCOSE-CAPILLARY: 108 mg/dL — AB (ref 65–99)

## 2016-12-03 MED ORDER — SODIUM CHLORIDE 0.9 % IV BOLUS (SEPSIS)
500.0000 mL | Freq: Once | INTRAVENOUS | Status: AC
  Administered 2016-12-03: 500 mL via INTRAVENOUS

## 2016-12-03 NOTE — ED Provider Notes (Signed)
[facility]-EMERGENCY DEPT Provider Note   CSN: [id number] Arrival date & time: 12/03/16  2131     History   Chief Complaint Chief Complaint  Patient presents with  . Elevated HR at Home    148 per Apple Watch    HPI Rachel Ferguson is a 83 y.o. female with a hx of GERD, migraines, hypothyroid presents to the Emergency Department complaining of HTN onset several weeks ago. Pt was treated for rash in early June and was given a prednisone dose pack.  This caused some HTN and her PCP started her on Norvasc for the HTN.  She reports she continues to be concerned about her high BP.  Pt reports she noted 3 episodes of an elevated HR into the 140-190s since May but 2 of them were today. She has associated "dizziness" onset over the last few weeks Which she describes as a vague feeling in her head. She denies vision changes, numbness. She reports requiring assistance with walking. She reports near syncopal episode today while at hair salon. She normally performs all of her ADLs without assistance.  Record review shows that she has seen her primary care physician several times of the last few weeks for the symptoms. On 11/26/2016 her TSH and free T4 were checked and within normal limits. On 11/30/2016 she had an MRI of her brain due to the dizziness and the report from the outside facility was "MRI was negative for any acute findings, no tumors, stroke, etc."    Pt is accompanied by her daughter-in-law and grandson are at bedside.  Patient lives with her son and grandson at home.  Apple watch interrogated with episode of HR into the 190's last week at 7am for a short period of time but resting rate is in the 50's.  The history is provided by the patient, a relative and medical records. No language interpreter was used.    Past Medical History:  Diagnosis Date  . Colon polyps   . GERD (gastroesophageal reflux disease)   . Migraines   . Osteopenia   . Thyroid disease     Patient Active Problem List   Diagnosis Date Noted  . Tachycardia 12/04/2016  . Corn of foot 11/27/2016  . Dizziness and giddiness 11/26/2016  . Word finding difficulty 11/26/2016  . Rash 11/20/2016  . HTN (hypertension) 11/20/2016  . Acute pain of right knee 10/19/2016  . Acute bilateral low back pain without sciatica 07/02/2016  . Segmental and somatic dysfunction of pelvic region 07/02/2016  . Segmental and somatic dysfunction of lumbar region 07/02/2016  . Precordial pain 05/27/2016  . Fatigue 05/27/2016  . Atypical chest pain 05/27/2016  . Nodule of apex of right lung 05/27/2016  . Diarrhea 04/06/2016  . Hypothyroidism 11/24/2015  . GERD (gastroesophageal reflux disease) 11/24/2015  . Insomnia 11/24/2015    Past Surgical History:  Procedure Laterality Date  . KNEE SURGERY Right   . TONSILLECTOMY AND ADENOIDECTOMY      OB History    No data available       Home Medications    Prior to Admission medications   Medication Sig Start Date End Date Taking? Authorizing Provider  amLODipine (NORVASC) 5 MG tablet Take 1 tablet (5 mg total) by mouth daily. Patient taking differently: Take 5 mg by mouth at bedtime.  11/26/16 11/26/17 Yes Corwin Levins, MD  levothyroxine (SYNTHROID, LEVOTHROID) 75 MCG tablet Take 1 tablet (75 mcg total) by mouth daily before breakfast. Patient taking differently: Take 75 mcg by  mouth at bedtime.  08/25/16  Yes Myrlene Brokerrawford, Elizabeth A, MD  mirtazapine (REMERON) 7.5 MG tablet Take 1 tablet (7.5 mg total) by mouth at bedtime. 08/27/16  Yes Myrlene Brokerrawford, Elizabeth A, MD  omeprazole (PRILOSEC) 20 MG capsule Take 1 capsule (20 mg total) by mouth daily. Patient taking differently: Take 20 mg by mouth at bedtime.  08/27/16  Yes Myrlene Brokerrawford, Elizabeth A, MD    Family History Family History  Problem Relation Age of Onset  . Cancer Mother        breast  . Heart disease Father   . Alzheimer's disease Sister   . Heart disease Maternal Grandmother   . Tuberculosis Paternal Grandmother   .  Cancer Sister   . Melanoma Sister     Social History Social History  Substance Use Topics  . Smoking status: Former Smoker    Packs/day: 0.25    Years: 5.00    Quit date: 05/05/1956  . Smokeless tobacco: Never Used  . Alcohol use 8.4 oz/week    14 Standard drinks or equivalent per week     Allergies   Patient has no known allergies.   Review of Systems Review of Systems  Constitutional: Positive for fatigue.  HENT: Negative for congestion, sinus pain, sinus pressure and sore throat.   Eyes: Negative for photophobia and visual disturbance.  Respiratory: Negative for chest tightness and shortness of breath.   Cardiovascular: Negative for chest pain and leg swelling.  Gastrointestinal: Negative for abdominal pain, blood in stool, diarrhea and nausea.  Endocrine: Negative for polydipsia, polyphagia and polyuria.  Genitourinary: Negative for dysuria.  Musculoskeletal: Negative for back pain.  Skin: Positive for rash ( resolved). Negative for wound.  Neurological: Positive for dizziness.  Hematological: Negative for adenopathy.  Psychiatric/Behavioral: Positive for confusion.  All other systems reviewed and are negative.    Physical Exam Updated Vital Signs BP (!) 159/67 (BP Location: Right Arm)   Pulse 62   Temp 97.9 F (36.6 C) (Oral)   Resp 18   SpO2 96%   Physical Exam  Constitutional: She is oriented to person, place, and time. She appears well-developed and well-nourished. No distress.  HENT:  Head: Normocephalic and atraumatic.  Mouth/Throat: Oropharynx is clear and moist.  Eyes: Conjunctivae and EOM are normal. Pupils are equal, round, and reactive to light. No scleral icterus.  No horizontal, vertical or rotational nystagmus  Neck: Normal range of motion. Neck supple.  Full active and passive ROM without pain No midline or paraspinal tenderness No nuchal rigidity or meningeal signs  Cardiovascular: Normal rate, regular rhythm and intact distal pulses.     Pulmonary/Chest: Effort normal and breath sounds normal. No respiratory distress. She has no wheezes. She has no rales.  Abdominal: Soft. Bowel sounds are normal. There is no tenderness. There is no rebound and no guarding.  Musculoskeletal: Normal range of motion.  Lymphadenopathy:    She has no cervical adenopathy.  Neurological: She is alert and oriented to person, place, and time. No cranial nerve deficit. She exhibits normal muscle tone. Coordination normal.  Mental Status:  Alert, oriented, thought content appropriate. Speech fluent without evidence of aphasia. Able to follow 2 step commands without difficulty.  Cranial Nerves:  II:  Peripheral visual fields grossly normal, pupils equal, round, reactive to light III,IV, VI: ptosis not present, extra-ocular motions intact bilaterally  V,VII: smile symmetric, facial light touch sensation equal VIII: hearing grossly normal bilaterally  IX,X: midline uvula rise  XI: bilateral shoulder shrug equal and strong  XII: midline tongue extension  Motor:  5/5 in upper and lower extremities bilaterally including strong and equal grip strength and dorsiflexion/plantar flexion Sensory: Pinprick and light touch normal in all extremities.  Cerebellar: normal finger-to-nose with bilateral upper extremities Gait: normal gait and balance CV: distal pulses palpable throughout   Skin: Skin is warm and dry. No rash noted. She is not diaphoretic.  Psychiatric: She has a normal mood and affect. Her behavior is normal. Judgment and thought content normal.  Nursing note and vitals reviewed.    ED Treatments / Results  Labs (all labs ordered are listed, but only abnormal results are displayed) Labs Reviewed  COMPREHENSIVE METABOLIC PANEL - Abnormal; Notable for the following:       Result Value   Glucose, Bld 101 (*)    BUN 25 (*)    All other components within normal limits  URINALYSIS, ROUTINE W REFLEX MICROSCOPIC - Abnormal; Notable for the  following:    Leukocytes, UA MODERATE (*)    Squamous Epithelial / LPF 0-5 (*)    All other components within normal limits  CBG MONITORING, ED - Abnormal; Notable for the following:    Glucose-Capillary 108 (*)    All other components within normal limits  CBC WITH DIFFERENTIAL/PLATELET  I-STAT TROPOININ, ED    EKG  EKG Interpretation  Date/Time:  Friday December 03 2016 23:32:03 EDT Ventricular Rate:  56 PR Interval:    QRS Duration: 88 QT Interval:  424 QTC Calculation: 410 R Axis:   58 Text Interpretation:  Sinus rhythm Minimal ST depression, inferior leads No old tracing to compare Confirmed by Rolan Bucco [phone]) on 12/03/2016 11:50:09 PM       Radiology No results found.  Procedures Procedures (including critical care time)  Medications Ordered in ED Medications  sodium chloride 0.9 % bolus 500 mL (500 mLs Intravenous New Bag/Given 12/03/16 2330)     Initial Impression / Assessment and Plan / ED Course  I have reviewed the triage vital signs and the nursing notes.  Pertinent labs & imaging results that were available during my care of the patient were reviewed by me and considered in my medical decision making (see chart for details).     Patient presents with dizziness, intermittent tachycardia and near syncope today.  Labs are reassuring. EKG is normal sinus rhythm however with episodes of significant tachycardia and near syncope I am concerned about potential paroxysmal A. fib. 24-hour cardiac monitoring would be beneficial. Discussed with Dr. Maryfrances Bunnell who will admit.  The patient was discussed with and seen by Dr. Fredderick Phenix who agrees with the treatment plan.   Final Clinical Impressions(s) / ED Diagnoses   Final diagnoses:  Near syncope  Tachycardia  Dizziness    New Prescriptions New Prescriptions   No medications on file     Milta Deiters 12/04/16 0116    Rolan Bucco, MD 12/04/16 1645

## 2016-12-03 NOTE — ED Notes (Signed)
ED Provider at bedside. 

## 2016-12-03 NOTE — ED Triage Notes (Signed)
Pt states that while at home she noticed that "apple watched showed elevated pulse." She denies any other symptoms.

## 2016-12-04 ENCOUNTER — Encounter (HOSPITAL_COMMUNITY): Payer: Self-pay | Admitting: Family Medicine

## 2016-12-04 ENCOUNTER — Observation Stay (HOSPITAL_BASED_OUTPATIENT_CLINIC_OR_DEPARTMENT_OTHER): Payer: Medicare Other

## 2016-12-04 DIAGNOSIS — E038 Other specified hypothyroidism: Secondary | ICD-10-CM | POA: Diagnosis not present

## 2016-12-04 DIAGNOSIS — R55 Syncope and collapse: Secondary | ICD-10-CM

## 2016-12-04 DIAGNOSIS — R42 Dizziness and giddiness: Secondary | ICD-10-CM

## 2016-12-04 DIAGNOSIS — R000 Tachycardia, unspecified: Secondary | ICD-10-CM | POA: Diagnosis not present

## 2016-12-04 DIAGNOSIS — I10 Essential (primary) hypertension: Secondary | ICD-10-CM

## 2016-12-04 DIAGNOSIS — K219 Gastro-esophageal reflux disease without esophagitis: Secondary | ICD-10-CM

## 2016-12-04 LAB — URINALYSIS, ROUTINE W REFLEX MICROSCOPIC
BILIRUBIN URINE: NEGATIVE
Bacteria, UA: NONE SEEN
Glucose, UA: NEGATIVE mg/dL
Hgb urine dipstick: NEGATIVE
KETONES UR: NEGATIVE mg/dL
Nitrite: NEGATIVE
Protein, ur: NEGATIVE mg/dL
Specific Gravity, Urine: 1.015 (ref 1.005–1.030)
pH: 6 (ref 5.0–8.0)

## 2016-12-04 LAB — CBC WITH DIFFERENTIAL/PLATELET
BASOS ABS: 0 10*3/uL (ref 0.0–0.1)
Basophils Relative: 1 %
EOS ABS: 0.1 10*3/uL (ref 0.0–0.7)
EOS PCT: 2 %
HCT: 41.1 % (ref 36.0–46.0)
Hemoglobin: 13.7 g/dL (ref 12.0–15.0)
Lymphocytes Relative: 28 %
Lymphs Abs: 1.9 10*3/uL (ref 0.7–4.0)
MCH: 31.1 pg (ref 26.0–34.0)
MCHC: 33.3 g/dL (ref 30.0–36.0)
MCV: 93.4 fL (ref 78.0–100.0)
MONO ABS: 0.7 10*3/uL (ref 0.1–1.0)
Monocytes Relative: 11 %
Neutro Abs: 3.9 10*3/uL (ref 1.7–7.7)
Neutrophils Relative %: 58 %
PLATELETS: 210 10*3/uL (ref 150–400)
RBC: 4.4 MIL/uL (ref 3.87–5.11)
RDW: 12 % (ref 11.5–15.5)
WBC: 6.7 10*3/uL (ref 4.0–10.5)

## 2016-12-04 LAB — COMPREHENSIVE METABOLIC PANEL
ALT: 16 U/L (ref 14–54)
ANION GAP: 7 (ref 5–15)
AST: 23 U/L (ref 15–41)
Albumin: 4 g/dL (ref 3.5–5.0)
Alkaline Phosphatase: 85 U/L (ref 38–126)
BUN: 25 mg/dL — AB (ref 6–20)
CHLORIDE: 107 mmol/L (ref 101–111)
CO2: 27 mmol/L (ref 22–32)
Calcium: 9.2 mg/dL (ref 8.9–10.3)
Creatinine, Ser: 0.85 mg/dL (ref 0.44–1.00)
GFR calc Af Amer: >60 mL/min (ref >60)
GFR calc non Af Amer: >60 mL/min (ref >60)
GLUCOSE: 101 mg/dL — AB (ref 65–99)
POTASSIUM: 4 mmol/L (ref 3.5–5.1)
Sodium: 141 mmol/L (ref 135–145)
Total Bilirubin: 0.4 mg/dL (ref 0.3–1.2)
Total Protein: 6.8 g/dL (ref 6.5–8.1)

## 2016-12-04 LAB — ECHOCARDIOGRAM COMPLETE
Height: 65 in
WEIGHTICAEL: 2068.8 [oz_av]

## 2016-12-04 MED ORDER — PANTOPRAZOLE SODIUM 40 MG PO TBEC
40.0000 mg | DELAYED_RELEASE_TABLET | Freq: Every day | ORAL | Status: DC
  Administered 2016-12-04: 40 mg via ORAL
  Filled 2016-12-04: qty 1

## 2016-12-04 MED ORDER — SODIUM CHLORIDE 0.9% FLUSH: 3.0000 mL | Freq: Two times a day (BID) | INTRAVENOUS | Status: DC

## 2016-12-04 MED ORDER — AMLODIPINE BESYLATE 5 MG PO TABS
5.0000 mg | ORAL_TABLET | ORAL | Status: AC
  Administered 2016-12-04: 5 mg via ORAL
  Filled 2016-12-04: qty 1

## 2016-12-04 MED ORDER — LEVOTHYROXINE SODIUM 75 MCG PO TABS
75.0000 ug | ORAL_TABLET | ORAL | Status: AC
  Administered 2016-12-04: 75 ug via ORAL
  Filled 2016-12-04: qty 1

## 2016-12-04 MED ORDER — MIRTAZAPINE 15 MG PO TABS
7.5000 mg | ORAL_TABLET | Freq: Once | ORAL | Status: AC
  Administered 2016-12-04: 7.5 mg via ORAL
  Filled 2016-12-04: qty 1

## 2016-12-04 MED ORDER — LEVOTHYROXINE SODIUM 75 MCG PO TABS: 75.0000 ug | ORAL_TABLET | Freq: Every day | ORAL | Status: DC

## 2016-12-04 MED ORDER — MIRTAZAPINE 15 MG PO TABS: 15.0000 mg | ORAL_TABLET | Freq: Every day | ORAL | Status: DC

## 2016-12-04 MED ORDER — ENOXAPARIN SODIUM 40 MG/0.4ML ~~LOC~~ SOLN: 40.0000 mg | SUBCUTANEOUS | Status: DC

## 2016-12-04 MED ORDER — AMLODIPINE BESYLATE 5 MG PO TABS: 5.0000 mg | ORAL_TABLET | Freq: Every day | ORAL | Status: DC

## 2016-12-04 MED ORDER — MIRTAZAPINE 7.5 MG PO TABS
7.5000 mg | ORAL_TABLET | Freq: Every day | ORAL | Status: DC
  Administered 2016-12-04: 7.5 mg via ORAL
  Filled 2016-12-04: qty 1

## 2016-12-04 MED ORDER — PANTOPRAZOLE SODIUM 40 MG PO TBEC: 40.0000 mg | DELAYED_RELEASE_TABLET | Freq: Every day | ORAL | Status: DC

## 2016-12-04 NOTE — H&P (Signed)
History and Physical  Patient Name: Rachel Ferguson     UEA:[id number]    DOB: 09/05/1930    DOA: 12/03/2016 PCP: Myrlene Brokerrawford, Elizabeth A, MD  Patient coming from: Home  Chief Complaint: Dizziness      HPI: Rachel ParkinsJulia Leske is a 83 y.o. female with a past medical history significant for hypothyroidism who presents with dizzy spells.  The patient was in her usual state of health until about a week ago when she started to have bothersome dizzy spells. These were worse with movement, not vertiginous. She saw her PCP who recommended outpatient MRI which was reportedly normal, per telephone notes. He also checked TSH which was also normal.  Then today, the patient drove herself to a hair appointment, when she walked in the door, she suddenly felt another spell, this time "just like I had to lay down". She did lay down on the ground or on a bench, and at that time noticed that her heart rate on her Apple watch showed 158 bpm.  The spell resolved suddenly by itself and she went home, but when she called her doctor's office they told her to come to the ER.  There was no dyspnea, chest discomfort during the episode.  There was no palpitations.  She has no previous history of arrhythmia.  ED course: -Afebrile, heart rate 62, respirations and pulse is normal, blood pressure 159/67 -Na 141, K 4.0, Cr 0.85, WBC 6.7K, Hgb 13.7 -Troponin negative -Urinalysis clear -ECG showed sinus bradycardia, rate 56 -TRH were asked to observe overnight for pre-syncope and arrhythmia     ROS: Review of Systems  Respiratory: Negative for shortness of breath.   Cardiovascular: Negative for chest pain, palpitations and leg swelling.  Neurological: Positive for dizziness. Negative for speech change, focal weakness and loss of consciousness.  All other systems reviewed and are negative.         Past Medical History:  Diagnosis Date  . Colon polyps   . GERD (gastroesophageal reflux disease)   . Migraines   . Osteopenia     . Thyroid disease     Past Surgical History:  Procedure Laterality Date  . KNEE SURGERY Right   . TONSILLECTOMY AND ADENOIDECTOMY      Social History: Patient lives alone.  The patient walks unassisted.  She still drives.  Nonsmoker.  No Known Allergies  Family history: family history includes Alzheimer's disease in her sister; Cancer in her mother and sister; Heart disease in her father and maternal grandmother; Melanoma in her sister; Tuberculosis in her paternal grandmother.  Prior to Admission medications   Medication Sig Start Date End Date Taking? Authorizing Provider  amLODipine (NORVASC) 5 MG tablet Take 1 tablet (5 mg total) by mouth daily. Patient taking differently: Take 5 mg by mouth at bedtime.  11/26/16 11/26/17 Yes Corwin LevinsJohn, James W, MD  levothyroxine (SYNTHROID, LEVOTHROID) 75 MCG tablet Take 1 tablet (75 mcg total) by mouth daily before breakfast. Patient taking differently: Take 75 mcg by mouth at bedtime.  08/25/16  Yes Myrlene Brokerrawford, Elizabeth A, MD  mirtazapine (REMERON) 7.5 MG tablet Take 1 tablet (7.5 mg total) by mouth at bedtime. 08/27/16  Yes Myrlene Brokerrawford, Elizabeth A, MD  omeprazole (PRILOSEC) 20 MG capsule Take 1 capsule (20 mg total) by mouth daily. Patient taking differently: Take 20 mg by mouth at bedtime.  08/27/16  Yes Myrlene Brokerrawford, Elizabeth A, MD       Physical Exam: BP 137/85 (BP Location: Left Arm)   Pulse 69   Temp  98.6 F (37 C) (Oral)   Resp 18   Ht 5\' 5"  (1.651 m)   Wt 58.7 kg (129 lb 4.8 oz)   SpO2 99%   BMI 21.52 kg/m  General appearance: Thin elderly adult female, alert and in no acute distress, irritated by the circumstances.   Eyes: Anicteric, conjunctiva pink, lids and lashes normal. PERRL.    ENT: No nasal deformity, discharge, epistaxis.  Hearing normal. OP moist without lesions.   Skin: Warm and dry.  No jaundice.   Cardiac: RRR, nl S1-S2, no murmurs appreciated.  Capillary refill is brisk.  Respiratory: Normal respiratory rate and rhythm.   CTAB without rales or wheezes. MSK: No deformities or effusions.  No cyanosis or clubbing. Neuro: Cranial nerves normal. Speech is fluent.  Muscle strength normal.    Psych: Sensorium intact and responding to questions, attention normal.  Behavior appropriate.  Affect irritated.  Judgment and insight appear moderately impaired.     Labs on Admission:  I have personally reviewed following labs and imaging studies: CBC:  Recent Labs Lab 12/03/16 2236  WBC 6.7  NEUTROABS 3.9  HGB 13.7  HCT 41.1  MCV 93.4  PLT 210   Basic Metabolic Panel:  Recent Labs Lab 12/03/16 2236  NA 141  K 4.0  CL 107  CO2 27  GLUCOSE 101*  BUN 25*  CREATININE 0.85  CALCIUM 9.2   GFR: Estimated Creatinine Clearance: 44.3 mL/min (by C-G formula based on SCr of 0.85 mg/dL).  Liver Function Tests:  Recent Labs Lab 12/03/16 2236  AST 23  ALT 16  ALKPHOS 85  BILITOT 0.4  PROT 6.8  ALBUMIN 4.0   No results for input(s): LIPASE, AMYLASE in the last 168 hours. No results for input(s): AMMONIA in the last 168 hours. Coagulation Profile: No results for input(s): INR, PROTIME in the last 168 hours. Cardiac Enzymes: No results for input(s): CKTOTAL, CKMB, CKMBINDEX, TROPONINI in the last 168 hours. BNP (last 3 results) No results for input(s): PROBNP in the last 8760 hours. HbA1C: No results for input(s): HGBA1C in the last 72 hours. CBG:  Recent Labs Lab 12/03/16 2246  GLUCAP 108*        EKG: Independently reviewed. Rate 56, QTc normal, normal sinus.    Assessment/Plan Principal Problem:   Tachycardia Active Problems:   Hypothyroidism   Dizziness and giddiness  1. Tachycardia:  This is reported from her iWatch.  Her ECG here is normal.   -Observe overnight on telemetry -Consult to Cardiology for outpatient cardiac monitoring if no events overnight  2. Hypothyroidism:  -Continue levothyroxine  3. Other medciations:  -Continue mirtazapine       DVT prophylaxis:  Lovenox  Code Status: FULL  Family Communication: None present  Disposition Plan: Anticipate monitoring overnight, consult to Cardiology tomorrow. Consults called: None overnight Admission status: OBS At the point of initial evaluation, it is my clinical opinion that admission for OBSERVATION is reasonable and necessary because the patient's presenting complaints in the context of their chronic conditions represent sufficient risk of deterioration or significant morbidity to constitute reasonable grounds for close observation in the hospital setting, but that the patient may be medically stable for discharge from the hospital within 24 to 48 hours.    Medical decision making: Patient seen at 2:30 AM on 12/04/2016.  The patient was discussed with Dierdre Forth, PA-C.  What exists of the patient's chart was reviewed in depth and summarized above.  Clinical condition: stable.  Edwin Dada [facility] Pager [phone]

## 2016-12-04 NOTE — Discharge Summary (Signed)
Physician Discharge Summary  Rachel Ferguson RUE:[id number] DOB: 1930-09-13 DOA: 12/03/2016  PCP: Rachel Broker, MD  Admit date: 12/03/2016 Discharge date: 12/04/2016  Admitted From: Home Disposition:  Home  Recommendations for Outpatient Follow-up:  1. Follow up with PCP in 1-2 weeks 2. Follow up with Cardiology for likely Event Monitor; Cardiology Clinic will call to schedule an appointment as an appointment could not be made because it was the weekend  3. Follow up with Neurology as an outpatient for evaluation "lightheaded/swimmyhead" feeling and for Dementia Evaluation 4. Please obtain CMP/CBC, Mag, Phos in one week  Home Health: No Equipment/Devices: None   Discharge Condition: Stable  CODE STATUS: FULL CODE Diet recommendation: Heart Healthy diet  Brief/Interim Summary: Rachel Ferguson is a 83 y.o. female with a past medical history significant for hypothyroidism who presents with dizzy spells.The patient was in her usual state of health until about a week ago when she started to have bothersome dizzy spells but states that she can't describe them properly. These were worse with movement, not vertiginous. She saw her PCP who recommended outpatient MRI which was reportedly normal, per telephone notes. Her TSH was also checked which was also normal. Then today, the patient drove herself to a hair appointment, when she walked in the door, she suddenly felt another spell, this time "just like I had to lay down". She did lay down on the ground or on a bench, and at that time noticed that her heart rate on her Apple watch showed 158 bpm. The spell resolved suddenly by itself and she went home, but when she called her doctor's office they told her to come to the ER.  There was no dyspnea, chest discomfort during the episode. There was no palpitations.  She has no previous history of arrhythmia. She was worked up and admitted for her Tachycardia and "Dizzy Spells." Throughout her hospitalization  the patient became extremely combative yelling at nursing staff and myself and stating that she just wanted to sleep and be left alone. I told her we we were trying to find the cause of her dizziness or dizzy spells and she would not comprehend why we needed to do more testing. She threw a fit in the hospital and after a lengthy discussion I was able to persuade her to obtain ECHOCardiogram testing. I discussed the case with Cardiology who stated she would need an event monitor as an outpatient if ECHO was normal as she recently had a normal MRI. Patient's labwork yesterday was normal and ECHO showed an EF of 60-65% with normal wall motion and no regional wall motion abnormalities. She was deemed medically stable as she did not have any arrythmias on telemetry for over 12 hours so she will be D/C'd Home and will need to follow up with PCP, Cardiology and have an outpatient Neurology evaluation for dementia and lightheadedness.   Discharge Diagnoses:  Principal Problem:   Tachycardia Active Problems:   Hypothyroidism   Dizziness and giddiness  1. ? Tachycardia -This is reported from her iWatch.  Her ECG here is normal.   -Observed overnight on Telemetry with No arrythmias -Discussed case with Cardiology Dr. Donato Schultz and he recommended outpatient cardiac monitoring as she had events overnight -ECHOCardiogram showed EF of 60-65% with no wall motion abnormalities and Grade 1 DD -Follow up with Cardiology as an outpatient for Event Monitoring and evaluation  2. Dizziness/Giddiness -Patient made it a point saying she never passed out -Unable to tell me fully what happened  and would not allow [place] to get Orthostatic Vital Signs -Given No Arrythmias on Telemetry and Normal ECHO will need Holter Monitor -MRI done and was read as normal -Will need Outpatient Neurology Evaluation for Dementia and Lightheaded Feeling that resolves when she lays down  3. Hypothyroidism:  -Continue Home  levothyroxine -Repeat TSH and Free T4 as an outpatient   4. Hypertension -C/w Amlodipine 5 mg po Daily  5. GERD -C/w Omeprazole  6. Depression -C/w Mirtazapine   Discharge Instructions  Discharge Instructions    Call MD for:  difficulty breathing, headache or visual disturbances    Complete by:  As directed    Call MD for:  extreme fatigue    Complete by:  As directed    Call MD for:  persistant dizziness or light-headedness    Complete by:  As directed    Call MD for:  persistant nausea and vomiting    Complete by:  As directed    Call MD for:  severe uncontrolled pain    Complete by:  As directed    Call MD for:  temperature >100.4    Complete by:  As directed    Diet - low sodium heart healthy    Complete by:  As directed    Discharge instructions    Complete by:  As directed    Follow up with PCP and Cardiology as an outpatient. Have PCP refer you to a Neurologist for your forgetfulness. Take all medications as prescribed. If symptoms change or worsen please return to the ED for evaluation. Cardiology Clinic will be contacting you to schedule an Event Monitor.   Increase activity slowly    Complete by:  As directed      Allergies as of 12/04/2016   No Known Allergies     Medication List    TAKE these medications   amLODipine 5 MG tablet Commonly known as:  NORVASC Take 1 tablet (5 mg total) by mouth daily. What changed:  when to take this   levothyroxine 75 MCG tablet Commonly known as:  SYNTHROID, LEVOTHROID Take 1 tablet (75 mcg total) by mouth daily before breakfast. What changed:  when to take this   mirtazapine 7.5 MG tablet Commonly known as:  REMERON Take 1 tablet (7.5 mg total) by mouth at bedtime.   omeprazole 20 MG capsule Commonly known as:  PRILOSEC Take 1 capsule (20 mg total) by mouth daily. What changed:  when to take this      Follow-up Information    Rachel Broker, MD. Call in 1 week(s).   Specialty:  Internal  Medicine Why:  Call to Schedule an appointment with 1 week.  Contact information: [address] [place] [address] [phone]        [facility] Follow up.   Why:  Cardiology will be calling you to schedule an appointment and a 30 day event monitor Contact information: [address] [place] [address] [phone]         No Known Allergies  Consultations:  Case was discussed with Cardiology Dr. Donato Schultz via Telephone Conversation  Procedures/Studies:  No results found.   ECHOCARDIOGRAM Study Conclusions  - Left ventricle: The cavity size was normal. Systolic function was   normal. The estimated ejection fraction was in the range of 60%   to 65%. Wall motion was normal; there were no regional wall   motion abnormalities. Doppler parameters are consistent with  abnormal left ventricular relaxation (grade 1 diastolic   dysfunction). - Aortic valve: Transvalvular velocity was within the normal range.   There was no stenosis. There was no regurgitation. - Mitral valve: Transvalvular velocity was within the normal range.   There was no evidence for stenosis. There was trivial   regurgitation. - Right ventricle: The cavity size was normal. Wall thickness was   normal. Systolic function was normal. - Atrial septum: No defect or patent foramen ovale was identified   by color flow Doppler. - Tricuspid valve: There was trivial regurgitation. - Pulmonic valve: There was moderate regurgitation. - Pulmonary arteries: Systolic pressure was within the normal   range. PA peak pressure: 30 mm Hg (S).  Subjective: Seen and examined at bedside and was extremely upset and belligerent and yelling at everyone saying that she didn't sleep and that's all that she cared about. Finally calmed down and allowed [place] to get ECHO. No complaints and states it was a mistake coming to the hospital and was  ready to go home.   Discharge Exam: Vitals:   12/04/16 0236 12/04/16 1129  BP: 137/85 140/60  Pulse: 69 (!) 58  Resp: 18 14  Temp: 98.6 F (37 C) 98.2 F (36.8 C)   Vitals:   12/03/16 2137 12/04/16 0002 12/04/16 0236 12/04/16 1129  BP: (!) 159/67 134/72 137/85 140/60  Pulse: 62 (!) 56 69 (!) 58  Resp: 18 16 18 14   Temp: 97.9 F (36.6 C)  98.6 F (37 C) 98.2 F (36.8 C)  TempSrc: Oral  Oral Oral  SpO2: 96% 100% 99% 98%  Weight:   58.7 kg (129 lb 4.8 oz)   Height:   5\' 5"  (1.651 m)    General: Pt is alert, awake, not in acute distress but acting very ornery  Cardiovascular: RRR, S1/S2 +, no rubs, no gallops Respiratory: CTA bilaterally, no wheezing, no rhonchi Abdominal: Soft, NT, ND, bowel sounds + Extremities: no edema, no cyanosis  The results of significant diagnostics from this hospitalization (including imaging, microbiology, ancillary and laboratory) are listed below for reference.    Microbiology: No results found for this or any previous visit (from the past 240 hour(s)).   Labs: BNP (last 3 results) No results for input(s): BNP in the last 8760 hours. Basic Metabolic Panel:  Recent Labs Lab 12/03/16 2236  NA 141  K 4.0  CL 107  CO2 27  GLUCOSE 101*  BUN 25*  CREATININE 0.85  CALCIUM 9.2   Liver Function Tests:  Recent Labs Lab 12/03/16 2236  AST 23  ALT 16  ALKPHOS 85  BILITOT 0.4  PROT 6.8  ALBUMIN 4.0   No results for input(s): LIPASE, AMYLASE in the last 168 hours. No results for input(s): AMMONIA in the last 168 hours. CBC:  Recent Labs Lab 12/03/16 2236  WBC 6.7  NEUTROABS 3.9  HGB 13.7  HCT 41.1  MCV 93.4  PLT 210   Cardiac Enzymes: No results for input(s): CKTOTAL, CKMB, CKMBINDEX, TROPONINI in the last 168 hours. BNP: Invalid input(s): POCBNP CBG:  Recent Labs Lab 12/03/16 2246  GLUCAP 108*   D-Dimer No results for input(s): DDIMER in the last 72 hours. Hgb A1c No results for input(s): HGBA1C in the last 72  hours. Lipid Profile No results for input(s): CHOL, HDL, LDLCALC, TRIG, CHOLHDL, LDLDIRECT in the last 72 hours. Thyroid function studies No results for input(s): TSH, T4TOTAL, T3FREE, THYROIDAB in the last 72 hours.  Invalid input(s): FREET3 Anemia work up No  results for input(s): VITAMINB12, FOLATE, FERRITIN, TIBC, IRON, RETICCTPCT in the last 72 hours. Urinalysis    Component Value Date/Time   COLORURINE YELLOW 12/04/2016 0001   APPEARANCEUR CLEAR 12/04/2016 0001   LABSPEC 1.015 12/04/2016 0001   PHURINE 6.0 12/04/2016 0001   GLUCOSEU NEGATIVE 12/04/2016 0001   GLUCOSEU NEGATIVE 11/26/2016 1215   HGBUR NEGATIVE 12/04/2016 0001   BILIRUBINUR NEGATIVE 12/04/2016 0001   KETONESUR NEGATIVE 12/04/2016 0001   PROTEINUR NEGATIVE 12/04/2016 0001   UROBILINOGEN 0.2 11/26/2016 1215   NITRITE NEGATIVE 12/04/2016 0001   LEUKOCYTESUR MODERATE (A) 12/04/2016 0001   Sepsis Labs Invalid input(s): PROCALCITONIN,  WBC,  LACTICIDVEN Microbiology No results found for this or any previous visit (from the past 240 hour(s)).  Time coordinating discharge: 35 minutes  SIGNED:  Merlene Laughtermair Latif Quana Chamberlain, DO [facility] 12/04/2016, 12:15 PM Pager [phone]  If 7PM-7AM, please contact night-coverage [email or web address] Password [email or web address]

## 2016-12-04 NOTE — Progress Notes (Signed)
  Echocardiogram 2D Echocardiogram has been performed. Patient does not want "syncope" to be in her chart. She states she has not had any signs or symptoms of passing out.   Elzora Cullins L Androw 12/04/2016, 11:16 AM

## 2016-12-04 NOTE — Progress Notes (Signed)
Called to room by pt. Pt wanting to know plan of care and when she could go home. Writer explained to pt that she was going to have an 2 D Echo of her heart today and depending on the results of that test would determine if she could safely be discharged. pt began aggressive and yelling at writer about how important her sleep was and that she was dizzy when she stands up. Writer explained to pt for her safety that Clinical research associatewriter didn't want her getting up walking around by her self because she may pass out or fall from being dizzy and that I was going to set the chair alarm. Writer ask pt if we could obtain vital signs that she had refused earlier this morning. pt stated "I'm not an idiot. I know that I'm not going to fall. How dumb do you think I am? I'm not just going to go oh I'm dizzy, I think I'll fall in the floor. And your not putting that bed bell on me either." Writer tried to explain to pt that she could pass out from being dizzy. Pt became more aggressive and told me to leave her room and not to bother her for the rest of the day. Writer explained to pt that the nurse tech or my self would come by and check on her every hour and if she needed anything to call and let us know. Pt  agreeable to someone come to check on her, but does not want to be bother or woke up for any reason.

## 2016-12-04 NOTE — Progress Notes (Signed)
Once patient was admitted to the floor, patient told RN to refused to get lab work, or vitals done in the early AM. Also, patient told Rn she did not want the RN or tech going in and out of her room to check on her. RN told patient she would come in if something abnormal showed up on her heart monitor.

## 2016-12-06 ENCOUNTER — Telehealth: Payer: Self-pay | Admitting: Internal Medicine

## 2016-12-06 ENCOUNTER — Telehealth: Payer: Self-pay | Admitting: *Deleted

## 2016-12-06 ENCOUNTER — Other Ambulatory Visit: Payer: Self-pay | Admitting: Cardiology

## 2016-12-06 DIAGNOSIS — R42 Dizziness and giddiness: Secondary | ICD-10-CM

## 2016-12-06 NOTE — Telephone Encounter (Signed)
Pt would like to know is she can get a referral to a cardiologists and a neurologists, she wants to know if her brain function is decreasing. She has a funny feeling in her head  She has an appt to discuss on Wednesday but she wants to know if it can be put in sooner than that, please call back

## 2016-12-06 NOTE — Telephone Encounter (Signed)
Called pt to set-up TCM Hosp f/u appt no answer LMOM RTC to set-up appt...[name]/lmb

## 2016-12-07 NOTE — Telephone Encounter (Signed)
Called pt to set-up appt for hosp f/u. Pt states she called back and made appt for 12/08/16. Inform pt had some additional question to ask concerning her discharge. Completed TCM call below.../lmb  Transition Care Management Follow-up Telephone Call   Date discharged? 12/04/16   How have you been since you were released from the hospital? Pt states she seem to be doing ok   Do you understand why you were in the hospital? YES   Do you understand the discharge instructions? YES   Where were you discharged to? Home   Items Reviewed:  Medications reviewed: YES  Allergies reviewed: YES  Dietary changes reviewed: NO  Referrals reviewed: YES, will discuss w/PCP at appt    Functional Questionnaire:   Activities of Daily Living (ADLs):   She states she are independent in the following: ambulation, bathing and hygiene, feeding, continence, grooming, toileting and dressing States she require assistance with the following: ambulation   Any transportation issues/concerns?: NO   Any patient concerns? NO   Confirmed importance and date/time of follow-up visits scheduled YES, pt made appt for 12/08/16  Provider Appointment booked with Dr. Jonny RuizJohn since PCP is out of the office  Confirmed with patient if condition begins to worsen call PCP or go to the ER.  Patient was given the office number and encouraged to call back with question or concerns.  : YES

## 2016-12-07 NOTE — Telephone Encounter (Signed)
Dr. Jonny RuizJohn please advise. Address now or wait until appt tomorrow?

## 2016-12-07 NOTE — Telephone Encounter (Signed)
Ok to see at appt, as there is little advantage to doing this today over tomorrow

## 2016-12-08 ENCOUNTER — Encounter: Payer: Self-pay | Admitting: Internal Medicine

## 2016-12-08 ENCOUNTER — Other Ambulatory Visit: Payer: Medicare Other

## 2016-12-08 ENCOUNTER — Ambulatory Visit (INDEPENDENT_AMBULATORY_CARE_PROVIDER_SITE_OTHER): Payer: Medicare Other | Admitting: Internal Medicine

## 2016-12-08 ENCOUNTER — Ambulatory Visit: Payer: Medicare Other

## 2016-12-08 ENCOUNTER — Other Ambulatory Visit: Payer: Self-pay | Admitting: Cardiology

## 2016-12-08 ENCOUNTER — Other Ambulatory Visit (INDEPENDENT_AMBULATORY_CARE_PROVIDER_SITE_OTHER): Payer: Medicare Other

## 2016-12-08 VITALS — BP 124/82 | HR 62 | Ht 65.0 in | Wt 128.0 lb

## 2016-12-08 DIAGNOSIS — R000 Tachycardia, unspecified: Secondary | ICD-10-CM

## 2016-12-08 DIAGNOSIS — L84 Corns and callosities: Secondary | ICD-10-CM | POA: Diagnosis not present

## 2016-12-08 DIAGNOSIS — R4789 Other speech disturbances: Secondary | ICD-10-CM | POA: Diagnosis not present

## 2016-12-08 DIAGNOSIS — R42 Dizziness and giddiness: Secondary | ICD-10-CM | POA: Diagnosis not present

## 2016-12-08 DIAGNOSIS — I10 Essential (primary) hypertension: Secondary | ICD-10-CM | POA: Diagnosis not present

## 2016-12-08 LAB — BASIC METABOLIC PANEL
BUN: 26 mg/dL — ABNORMAL HIGH (ref 6–23)
CHLORIDE: 102 meq/L (ref 96–112)
CO2: 31 meq/L (ref 19–32)
Calcium: 9.6 mg/dL (ref 8.4–10.5)
Creatinine, Ser: 0.85 mg/dL (ref 0.40–1.20)
GFR: 67.66 mL/min (ref >60.00)
GLUCOSE: 91 mg/dL (ref 70–99)
Potassium: 4.5 mEq/L (ref 3.5–5.1)
Sodium: 138 mEq/L (ref 135–145)

## 2016-12-08 LAB — CBC WITH DIFFERENTIAL/PLATELET
Basophils Absolute: 0 10*3/uL (ref 0.0–0.1)
Basophils Relative: 0.8 % (ref 0.0–3.0)
EOS ABS: 0 10*3/uL (ref 0.0–0.7)
Eosinophils Relative: 1 % (ref 0.0–5.0)
HCT: 40 % (ref 36.0–46.0)
Hemoglobin: 13.6 g/dL (ref 12.0–15.0)
LYMPHS ABS: 1.6 10*3/uL (ref 0.7–4.0)
Lymphocytes Relative: 30.6 % (ref 12.0–46.0)
MCHC: 34 g/dL (ref 30.0–36.0)
MCV: 93.7 fl (ref 78.0–100.0)
MONO ABS: 0.5 10*3/uL (ref 0.1–1.0)
MONOS PCT: 10 % (ref 3.0–12.0)
NEUTROS ABS: 3 10*3/uL (ref 1.4–7.7)
NEUTROS PCT: 57.6 % (ref 43.0–77.0)
PLATELETS: 218 10*3/uL (ref 150.0–400.0)
RBC: 4.27 Mil/uL (ref 3.87–5.11)
RDW: 12.3 % (ref 11.5–15.5)
WBC: 5.1 10*3/uL (ref 4.0–10.5)

## 2016-12-08 LAB — HEPATIC FUNCTION PANEL
ALBUMIN: 4.4 g/dL (ref 3.5–5.2)
ALK PHOS: 75 U/L (ref 39–117)
ALT: 12 U/L (ref 0–35)
AST: 20 U/L (ref 0–37)
BILIRUBIN TOTAL: 0.7 mg/dL (ref 0.2–1.2)
Bilirubin, Direct: 0.1 mg/dL (ref 0.0–0.3)
Total Protein: 7.1 g/dL (ref 6.0–8.3)

## 2016-12-08 LAB — MAGNESIUM: Magnesium: 2.2 mg/dL (ref 1.5–2.5)

## 2016-12-08 NOTE — Patient Instructions (Signed)
Please accept and do the Cardiac Event monitor due to your recent elevated heart rates to see if these are recurring  You will be contacted regarding the referral for: Neurology, and podiatry  Please continue all other medications as before, and refills have been done if requested.  Please have the pharmacy call with any other refills you may need.  Please keep your appointments with your specialists as you may have planned  Please go to the LAB in the Basement (turn left off the elevator) for the tests to be done today  You will be contacted by phone if any changes need to be made immediately.  Otherwise, you will receive a letter about your results with an explanation, but please check with MyChart first.  Please remember to sign up for MyChart if you have not done so, as this will be important to you in the future with finding out test results, communicating by private email, and scheduling acute appointments online when needed.

## 2016-12-08 NOTE — Progress Notes (Signed)
Subjective:    Patient ID: Rachel ParkinsJulia Ferguson, female    DOB: 01/18/1931, 83 y.o.   MRN: [id number]  HPI  Here to f/u with me as PCP is on medical leave;  Pt is seen after recent hospn 6/22-23 after 1 wk onset gradually debilitating dizzy spells despite recent TSH and Head MRI negative.  Pt did note on admit she had at one point noted her Apple watch with HR 158.  Was hospd for further evaluation with hospn remarkable for recurring agiation with yelling at the staff and providers.  Tele neg for arrythmia overnight; Echo was c/w LV AEF 60-65%, and mild diast dysfxn. Pt was recommended for outpatient cardiac event monitring. Pt presents appearing eager to be judged as doing well today.  She is somewhat defensive about recent events and effusively states "I feel great today, Im having no further confusion or dizziness, and my heart seems back to normal."  Wonders if can stop her BP pill since it back to normal, and was apparently contacted this am regarding the cardiac event monitor but did not pursue the arrangement b/c she wanted to "check here first."  BP Readings from Last 3 Encounters:  12/08/16 124/82  12/04/16 140/60  11/26/16 (!) 168/100  Pt states No palpitations and wondering if she really needs the Cardiac monitor She feels her confusion and dizziness has resolved where she could not find words to say, and seemed to start after a bike ride..  Had some "anxiety and mood swings" in the hosp, has no desire to be in bed anymore, and felt she was sort corralled for a while.  Also mentions has seen allergist without specific cuase for allergy except to mites and cockroach but denies current symptoms  She is not driving Past Medical History:  Diagnosis Date  . Colon polyps   . GERD (gastroesophageal reflux disease)   . Migraines   . Osteopenia   . Thyroid disease    Past Surgical History:  Procedure Laterality Date  . KNEE SURGERY Right   . TONSILLECTOMY AND ADENOIDECTOMY      reports that she quit  smoking about 60 years ago. She has a 1.25 pack-year smoking history. She has never used smokeless tobacco. She reports that she drinks about 8.4 oz of alcohol per week . She reports that she does not use drugs. family history includes Alzheimer's disease in her sister; Cancer in her mother and sister; Heart disease in her father and maternal grandmother; Melanoma in her sister; Tuberculosis in her paternal grandmother. No Known Allergies Current Outpatient Prescriptions on File Prior to Visit  Medication Sig Dispense Refill  . amLODipine (NORVASC) 5 MG tablet Take 1 tablet (5 mg total) by mouth daily. (Patient taking differently: Take 5 mg by mouth at bedtime. ) 90 tablet 3  . levothyroxine (SYNTHROID, LEVOTHROID) 75 MCG tablet Take 1 tablet (75 mcg total) by mouth daily before breakfast. (Patient taking differently: Take 75 mcg by mouth at bedtime. ) 90 tablet 1  . mirtazapine (REMERON) 7.5 MG tablet Take 1 tablet (7.5 mg total) by mouth at bedtime. 30 tablet 6  . omeprazole (PRILOSEC) 20 MG capsule Take 1 capsule (20 mg total) by mouth daily. (Patient taking differently: Take 20 mg by mouth at bedtime. ) 90 capsule 3   No current facility-administered medications on file prior to visit.    Review of Systems  Constitutional: Negative for other unusual diaphoresis or sweats HENT: Negative for ear discharge or swelling Eyes: Negative for other  worsening visual disturbances Respiratory: Negative for stridor or other swelling  Gastrointestinal: Negative for worsening distension or other blood Genitourinary: Negative for retention or other urinary change Musculoskeletal: Negative for other MSK pain or swelling Skin: Negative for color change or other new lesions Neurological: Negative for worsening tremors and other numbness  Psychiatric/Behavioral: Negative for worsening agitation or other fatigue All other system neg per pt    Objective:   Physical Exam BP 124/82   Pulse 62   Ht 5\' 5"   (1.651 m)   Wt 128 lb (58.1 kg)   SpO2 98%   BMI 21.30 kg/m  VS noted,  Constitutional: Pt appears in NAD HENT: Head: NCAT.  Right Ear: External ear normal.  Left Ear: External ear normal.  Eyes: . Pupils are equal, round, and reactive to light. Conjunctivae and EOM are normal Nose: without d/c or deformity Neck: Neck supple. Gross normal ROM Cardiovascular: Normal rate and regular rhythm.   Pulmonary/Chest: Effort normal and breath sounds without rales or wheezing.  Abd:  Soft, NT, ND, + BS, no organomegaly Neurological: Pt is alert. At baseline orientation and appears again to have abnormal recent memory faults and judgement may be impaired , motor grossly intact Skin: Skin is warm. No rashes, other new lesions, no LE edema Psychiatric: Pt behavior is normal without agitation somewhat emotionally labile today  Lab Results  Component Value Date   WBC 5.1 12/08/2016   HGB 13.6 12/08/2016   HCT 40.0 12/08/2016   PLT 218.0 12/08/2016   GLUCOSE 91 12/08/2016   CHOL 189 11/24/2015   TRIG 86.0 11/24/2015   HDL 66.70 11/24/2015   LDLCALC 105 (H) 11/24/2015   ALT 12 12/08/2016   AST 20 12/08/2016   NA 138 12/08/2016   K 4.5 12/08/2016   CL 102 12/08/2016   CREATININE 0.85 12/08/2016   BUN 26 (H) 12/08/2016   CO2 31 12/08/2016   TSH 3.12 11/26/2016       Assessment & Plan:

## 2016-12-09 ENCOUNTER — Encounter: Payer: Self-pay | Admitting: Neurology

## 2016-12-11 NOTE — Assessment & Plan Note (Signed)
I did encourage the pt to pursue the cardiac event monitor as recommended, even though she states she has had not further dizziness, confusion or tachycardia noted on her watch

## 2016-12-11 NOTE — Assessment & Plan Note (Addendum)
Completely symptoms free per pt, unclear if being forthright and may have secondary gain in saying this, she appears to have underlying mild dementia assoc with behavioral issue and some emotional lability; I have recommended she be referred for Neuropsych evaluation witih the [facility] neuropsychologist but she adamantly declines as she does not believe she has any significant problem; she will accept neurology referral as this was mentioned at her last hospn

## 2016-12-11 NOTE — Assessment & Plan Note (Signed)
Incidentally with left foot corn with more pain in the past month, for podiatry referral

## 2016-12-11 NOTE — Assessment & Plan Note (Signed)
stable overall by history and exam, recent data reviewed with pt, and pt to continue medical treatment as before as although she believes she is likely resolved of this issue, I suspect she is not, and to f/u any worsening symptoms or concerns

## 2016-12-11 NOTE — Assessment & Plan Note (Signed)
Completely symptoms free per pt, unclear if being forthright and may have secondary gain in saying this, cont to monitor

## 2016-12-22 ENCOUNTER — Telehealth: Payer: Self-pay | Admitting: Cardiology

## 2016-12-22 NOTE — Telephone Encounter (Signed)
Spoke with pt today she advised that she did not want to get the Cardiac event monitor. I have removed the order from the system due to patient declining services.   Lamar LaundrySonya

## 2016-12-29 ENCOUNTER — Ambulatory Visit: Payer: Medicare Other | Admitting: Podiatry

## 2017-01-03 ENCOUNTER — Ambulatory Visit (INDEPENDENT_AMBULATORY_CARE_PROVIDER_SITE_OTHER): Payer: Medicare Other | Admitting: Neurology

## 2017-01-03 ENCOUNTER — Encounter: Payer: Self-pay | Admitting: Neurology

## 2017-01-03 VITALS — BP 124/84 | HR 57 | Ht 65.0 in | Wt 128.1 lb

## 2017-01-03 DIAGNOSIS — G3184 Mild cognitive impairment, so stated: Secondary | ICD-10-CM | POA: Diagnosis not present

## 2017-01-03 NOTE — Patient Instructions (Signed)
1. Start daily B12 supplement 500mcg 2. Schedule Neurocognitive testing with Dr. Alyson InglesBailar  You have been referred for a neurocognitive evaluation in our office.   The evaluation takes approximately two hours. The first part of the appointment is a clinical interview with the neuropsychologist (Dr. Elvis CoilMaryBeth Bailar). Please bring someone with you to this appointment if possible, as it is helpful for Dr. Alinda DoomsBailar to hear from both you and another adult who knows you well. After speaking with Dr. Alinda DoomsBailar, you will complete testing with her technician. The testing includes a variety of tasks- mostly question-and-answer, some paper-and-pencil. There is nothing you need to do to prepare for this appointment, but having a good night's sleep prior to the testing, and bringing eyeglasses and hearing aids (if you wear them), is advised.   About a week after the evaluation, you will return to follow up with Dr. Alinda DoomsBailar to review the test results. This appointment is about 30 minutes. If you would like a family member to receive this information as well, please bring them to the appointment.   We have to reserve several hours of the neuropsychologist's time and the psychometrician's time for your evaluation appointment. As such, please note that there is a No-Show fee of $100. If you are unable to attend any of your appointments, please contact our office as soon as possible to reschedule.

## 2017-01-03 NOTE — Progress Notes (Signed)
NEUROLOGY CONSULTATION NOTE  Rachel Ferguson MRN: [id number] DOB: 1931/05/13  Referring provider: Dr. [name] Primary care provider: Dr. [name]  Reason for consult:  Dizziness, word-finding difficulties  Dear [name]:  Thank you for your kind referral of Rachel Ferguson for consultation of the above symptoms. Although her history is well known to you, please allow me to reiterate it for the purpose of our medical record. The patient was accompanied to the clinic by her daughter and son who lives in [place], who also provide collateral information. Records and images were personally reviewed where available.  HISTORY OF PRESENT ILLNESS: This is an 83 year old right-handed woman with a history of hypertension, hypothyroidism, depression, presenting for evaluation of dizziness and word-finding difficulties. She was admitted to [facility] a month ago for dizziness worse with movements. She had an MRI brain that was reportedly normal. TSH normal. She drove herself to her hair appointment and as she walked in, she had another spell where she felt she had to lay down. She lay down and noticed her heart rate on the Apple watch was 158 bpm. Symptoms lasted a few minutes, she called her PCP who instructed her to go to the ER. She keeps repeating today that it was a big mistake to go to the ER. During her hospitalization, it was noted that she became extremely combative, yelling at nursing staff and physicians, stating she just wanted to sleep and be left alone. She could not comprehend why testing was needed to be done for the dizziness. She finally agreed to an echocardiogram, which was normal, EF 60-65%. There was no arrhythmia on telemetry for over 12 hours, it was agreed to discharge her home and had an outpatient event monitor. She has refused this and states she has not had any further dizziness or other issues. She was taken off her BP medication on last PCP visit. She does recall her hospital  stay, saying her head was not quite right, not spinning, "like a funny feeling," no headache, tinnitus, nausea/vomiting, focal numbness/tingling. She states "I wish I had not gone, I'm sorry I did." She reports BP has been better, she has been travelling and feels normal. She reports her memory "could be better," she does not she forgets events but had a strenuous trip. She lives with her son and grandson, who are not present today. According to her daughter, she started noticing gradual memory decline since the patient's spouse passed away 6 years ago. She loses her phone all the time. She forgot her medications maybe once. Her daughter reports she has lost her car several times, the patient gets upset and reports it has only happened twice, and there was a reason for them. One time she could not find her car after seeing her doctor in June. Her other son has had to help her find her car a few times. She gets more disoriented driving, especially in [place], where she used to live for many years. She states she has a wonderful GPS. Her children report that if GPS is unclear, she has a problem. Even going to the doctor's office, she could not remember exactly where it was. She has good and bad days. She got upset when her daughter reported problems with executive functioning, citing an instance with unpacking boxes into their remodeled house. She was "leaving trails" everywhere, she states she was trying to get order out of chaos. Her daughter reports impulsivity and concerns about her judgement. She has never struggled  with anxiety, but several times recently, she has gotten panicked. One instance was when she was on the way to the hospital, she was anxious and panicked. She denies any missed bill payments. She denies any headaches, any further dizziness, diplopia, dysarthria/dysphagia, neck/back pain, focal numbness/tingling/weakness, bowel/bladder dysfunction, anosmia, or tremors. No difficulties with ADLs, no  hallucinations. Her older sister had dementia. She denies any history of significant head injuries. She drinks at least one glass of red wine most every night.   PAST MEDICAL HISTORY: Past Medical History:  Diagnosis Date  . Colon polyps   . GERD (gastroesophageal reflux disease)   . Migraines   . Osteopenia   . Thyroid disease     PAST SURGICAL HISTORY: Past Surgical History:  Procedure Laterality Date  . KNEE SURGERY Right   . TONSILLECTOMY AND ADENOIDECTOMY      MEDICATIONS: Current Outpatient Prescriptions on File Prior to Visit  Medication Sig Dispense Refill  . amLODipine (NORVASC) 5 MG tablet Take 1 tablet (5 mg total) by mouth daily. (Patient taking differently: Take 5 mg by mouth at bedtime. ) 90 tablet 3  . levothyroxine (SYNTHROID, LEVOTHROID) 75 MCG tablet Take 1 tablet (75 mcg total) by mouth daily before breakfast. (Patient taking differently: Take 75 mcg by mouth at bedtime. ) 90 tablet 1  . mirtazapine (REMERON) 7.5 MG tablet Take 1 tablet (7.5 mg total) by mouth at bedtime. 30 tablet 6  . omeprazole (PRILOSEC) 20 MG capsule Take 1 capsule (20 mg total) by mouth daily. (Patient taking differently: Take 20 mg by mouth at bedtime. ) 90 capsule 3   No current facility-administered medications on file prior to visit.     ALLERGIES: No Known Allergies  FAMILY HISTORY: Family History  Problem Relation Age of Onset  . Cancer Mother        breast  . Heart disease Father   . Alzheimer's disease Sister   . Heart disease Maternal Grandmother   . Tuberculosis Paternal Grandmother   . Cancer Sister   . Melanoma Sister     SOCIAL HISTORY: Social History   Social History  . Marital status: Widowed    Spouse name: N/A  . Number of children: N/A  . Years of education: N/A   Occupational History  . Not on file.   Social History Main Topics  . Smoking status: Former Smoker    Packs/day: 0.25    Years: 5.00    Quit date: 05/05/1956  . Smokeless tobacco:  Never Used  . Alcohol use 8.4 oz/week    14 Standard drinks or equivalent per week  . Drug use: No  . Sexual activity: Not on file   Other Topics Concern  . Not on file   Social History Narrative  . No narrative on file    REVIEW OF SYSTEMS: Constitutional: No fevers, chills, or sweats, no generalized fatigue, change in appetite Eyes: No visual changes, double vision, eye pain Ear, nose and throat: No hearing loss, ear pain, nasal congestion, sore throat Cardiovascular: No chest pain, palpitations Respiratory:  No shortness of breath at rest or with exertion, wheezes GastrointestinaI: No nausea, vomiting, diarrhea, abdominal pain, fecal incontinence Genitourinary:  No dysuria, urinary retention or frequency Musculoskeletal:  No neck pain, back pain Integumentary: No rash, pruritus, skin lesions Neurological: as above Psychiatric: No depression, insomnia, anxiety Endocrine: No palpitations, fatigue, diaphoresis, mood swings, change in appetite, change in weight, increased thirst Hematologic/Lymphatic:  No anemia, purpura, petechiae. Allergic/Immunologic: no itchy/runny eyes, nasal congestion,  recent allergic reactions, rashes  PHYSICAL EXAM: Vitals:   01/03/17 1249  BP: 124/84  Pulse: (!) 57   General: No acute distress Head:  Normocephalic/atraumatic Eyes: Fundoscopic exam shows bilateral sharp discs, no vessel changes, exudates, or hemorrhages Neck: supple, no paraspinal tenderness, full range of motion Back: No paraspinal tenderness Heart: regular rate and rhythm Lungs: Clear to auscultation bilaterally. Vascular: No carotid bruits. Skin/Extremities: No rash, no edema Neurological Exam: Mental status: alert and oriented to person, place, and time, no dysarthria or aphasia, Fund of knowledge is appropriate.  Recent and remote memory are intact.  Attention and concentration are normal.    Able to name objects and repeat phrases.  Montreal Cognitive Assessment  01/03/2017    Visuospatial/ Executive (0/5) 3  Naming (0/3) 2  Attention: Read list of digits (0/2) 2  Attention: Read list of letters (0/1) 1  Attention: Serial 7 subtraction starting at 100 (0/3) 3  Language: Repeat phrase (0/2) 2  Language : Fluency (0/1) 1  Abstraction (0/2) 2  Delayed Recall (0/5) 5  Orientation (0/6) 6  Total 27   Cranial nerves: CN I: not tested CN II: pupils equal, round and reactive to light, visual fields intact, fundi unremarkable. CN III, IV, VI:  full range of motion, no nystagmus, no ptosis CN V: facial sensation intact CN VII: upper and lower face symmetric CN VIII: hearing intact to finger rub CN IX, X: gag intact, uvula midline CN XI: sternocleidomastoid and trapezius muscles intact CN XII: tongue midline Bulk & Tone: normal, no fasciculations. Motor: 5/5 throughout with no pronator drift. Sensation: intact to light touch, cold, pin, vibration and joint position sense.  No extinction to double simultaneous stimulation.  Romberg test negative Deep Tendon Reflexes: +2 throughout, no ankle clonus Plantar responses: downgoing bilaterally Cerebellar: no incoordination on finger to nose, heel to shin. No dysdiadochokinesia Gait: narrow-based and steady, able to tandem walk adequately. Tremor: none  IMPRESSION: This is an 83 year old right-handed woman with a history of hypertension, hypothyroidism, depression, presenting for evaluation of word-finding difficulties and dizziness. On further questioning and review of recent hospitalization, she was very aggressive in the hospital, with concern for underlying dementia. She feels she is doing well and would get defensive with her daughter's accounts on cognitive changes that also affected driving. She had a normal MOCA score of 27/30, however history is concerning for possible mild dementia. She is agreeable to Neurocognitive testing to further delineate symptoms. She reports the dizziness that prompted hospital visit has  resolved, MRI brain unremarkable, possibly vestibular neuritis. Neurological exam today normal.  She was noted to have low normal B12 and will start a daily supplement. Continue to monitor driving, we discussed that if symptoms worsen, would stop driving. She will follow-up after Neurocognitive testing.   Thank you for allowing me to participate in the care of this patient. Please do not hesitate to call for any questions or concerns.   Patrcia Dolly, M.D.  CC: Dr. Okey Dupre, Dr. Jonny Ruiz

## 2017-01-04 ENCOUNTER — Ambulatory Visit: Payer: Medicare Other | Admitting: Allergy and Immunology

## 2017-01-05 ENCOUNTER — Ambulatory Visit: Payer: Medicare Other | Admitting: Podiatry

## 2017-01-05 ENCOUNTER — Telehealth: Payer: Self-pay | Admitting: Internal Medicine

## 2017-01-05 MED ORDER — MIRTAZAPINE 7.5 MG PO TABS: 7.5000 mg | ORAL_TABLET | Freq: Every day | ORAL | 0 refills | Status: DC

## 2017-01-05 NOTE — Telephone Encounter (Signed)
Reviewed chart pt has visit w/Dr. Jonny RuizJohn medication is continue sent 90 day script to expree scripts...[name]/lmb

## 2017-01-05 NOTE — Telephone Encounter (Signed)
mirtazapine (REMERON) 7.5 MG tablet   Patient is requesting a refill on this medication.

## 2017-01-06 ENCOUNTER — Telehealth: Payer: Self-pay | Admitting: Internal Medicine

## 2017-01-06 MED ORDER — MIRTAZAPINE 7.5 MG PO TABS: 7.5000 mg | ORAL_TABLET | Freq: Every day | ORAL | 0 refills | Status: DC

## 2017-01-06 NOTE — Telephone Encounter (Signed)
Per chart pt saw dr. Jonny RuizJohn on 6/28 he sent a 90 day to mail service. Sent a 14 day supply to [facility]. Notified pt med has been sent...[name]/lmb

## 2017-01-06 NOTE — Telephone Encounter (Signed)
Pt came by and said that the she will not get her Mirtazapine in to to leave for the beach Saturday.  [facility] [address]  .  She said that she needs asap!!

## 2017-01-07 ENCOUNTER — Encounter: Payer: Self-pay | Admitting: Neurology

## 2017-02-23 ENCOUNTER — Other Ambulatory Visit: Payer: Self-pay | Admitting: Internal Medicine

## 2017-02-25 ENCOUNTER — Other Ambulatory Visit: Payer: Self-pay | Admitting: Internal Medicine

## 2017-03-03 ENCOUNTER — Other Ambulatory Visit (INDEPENDENT_AMBULATORY_CARE_PROVIDER_SITE_OTHER): Payer: Medicare Other

## 2017-03-03 ENCOUNTER — Ambulatory Visit (INDEPENDENT_AMBULATORY_CARE_PROVIDER_SITE_OTHER): Payer: Medicare Other | Admitting: Internal Medicine

## 2017-03-03 ENCOUNTER — Encounter: Payer: Self-pay | Admitting: Internal Medicine

## 2017-03-03 VITALS — BP 140/80 | HR 59 | Temp 98.0°F | Resp 16 | Ht 65.0 in | Wt 132.8 lb

## 2017-03-03 DIAGNOSIS — E038 Other specified hypothyroidism: Secondary | ICD-10-CM | POA: Diagnosis not present

## 2017-03-03 DIAGNOSIS — I10 Essential (primary) hypertension: Secondary | ICD-10-CM

## 2017-03-03 DIAGNOSIS — L299 Pruritus, unspecified: Secondary | ICD-10-CM | POA: Diagnosis not present

## 2017-03-03 LAB — CBC WITH DIFFERENTIAL/PLATELET
BASOS ABS: 0.1 10*3/uL (ref 0.0–0.1)
Basophils Relative: 1 % (ref 0.0–3.0)
EOS PCT: 1.6 % (ref 0.0–5.0)
Eosinophils Absolute: 0.1 10*3/uL (ref 0.0–0.7)
HCT: 37.8 % (ref 36.0–46.0)
HEMOGLOBIN: 12.7 g/dL (ref 12.0–15.0)
LYMPHS ABS: 1.5 10*3/uL (ref 0.7–4.0)
Lymphocytes Relative: 25.5 % (ref 12.0–46.0)
MCHC: 33.7 g/dL (ref 30.0–36.0)
MCV: 96.4 fl (ref 78.0–100.0)
MONO ABS: 0.5 10*3/uL (ref 0.1–1.0)
MONOS PCT: 8.6 % (ref 3.0–12.0)
NEUTROS PCT: 63.3 % (ref 43.0–77.0)
Neutro Abs: 3.7 10*3/uL (ref 1.4–7.7)
Platelets: 222 10*3/uL (ref 150.0–400.0)
RBC: 3.92 Mil/uL (ref 3.87–5.11)
RDW: 12.1 % (ref 11.5–15.5)
WBC: 5.9 10*3/uL (ref 4.0–10.5)

## 2017-03-03 LAB — TSH: TSH: 4 u[IU]/mL (ref 0.35–4.50)

## 2017-03-03 LAB — SEDIMENTATION RATE: SED RATE: 16 mm/h (ref 0–30)

## 2017-03-03 NOTE — Progress Notes (Signed)
Subjective:  Patient ID: Rachel Ferguson, female    DOB: 1931/05/10  Age: 83 y.o. MRN: [id number]  CC: Pruritis   HPI Rachel Ferguson presents for a several week history of diffuse itching with no rash. She's noticed itching on her scalp, chest, and extremities. She tells me she saw a dermatologist about this and his recommendations were not helpful. The night prior to this visit she was seen in urgent care center and was prescribed hydroxyzine. She has taken 1 dose with mild symptom relief.  Outpatient Medications Prior to Visit  Medication Sig Dispense Refill  . levothyroxine (SYNTHROID, LEVOTHROID) 75 MCG tablet TAKE 1 TABLET DAILY BEFORE BREAKFAST 90 tablet 0  . mirtazapine (REMERON) 7.5 MG tablet Take 1 tablet (7.5 mg total) by mouth at bedtime. 14 tablet 0  . omeprazole (PRILOSEC) 20 MG capsule Take 1 capsule (20 mg total) by mouth daily. (Patient taking differently: Take 20 mg by mouth at bedtime. ) 90 capsule 3  . amLODipine (NORVASC) 5 MG tablet Take 1 tablet (5 mg total) by mouth daily. (Patient not taking: Reported on 01/03/2017) 90 tablet 3   No facility-administered medications prior to visit.     ROS Review of Systems  Constitutional: Negative.  Negative for appetite change, chills, diaphoresis, fatigue, fever and unexpected weight change.  HENT: Negative.  Negative for facial swelling, sore throat and trouble swallowing.   Eyes: Negative.  Negative for visual disturbance.  Respiratory: Negative.  Negative for cough, chest tightness, shortness of breath and wheezing.   Cardiovascular: Negative for chest pain, palpitations and leg swelling.  Gastrointestinal: Negative for abdominal pain, constipation, diarrhea, nausea and vomiting.  Endocrine: Negative.   Genitourinary: Negative.  Negative for difficulty urinating.  Musculoskeletal: Negative.  Negative for back pain and myalgias.  Skin: Negative.  Negative for color change, pallor, rash and wound.  Allergic/Immunologic: Negative.    Neurological: Negative.  Negative for dizziness and headaches.  Hematological: Negative for adenopathy. Does not bruise/bleed easily.  Psychiatric/Behavioral: Negative.     Objective:  BP 140/80 (BP Location: Left Arm, Patient Position: Sitting, Cuff Size: Normal)   Pulse (!) 59   Temp 98 F (36.7 C) (Oral)   Resp 16   Ht  (1.651 m)   Wt 132 lb 12 oz (60.2 kg)   SpO2 98%   BMI 22.09 kg/m   BP Readings from Last 3 Encounters:  03/03/17 140/80  01/03/17 124/84  12/08/16 124/82    Wt Readings from Last 3 Encounters:  03/03/17 132 lb 12 oz (60.2 kg)  01/03/17 128 lb 2 oz (58.1 kg)  12/08/16 128 lb (58.1 kg)    Physical Exam  Constitutional: She is oriented to person, place, and time. No distress.  HENT:  Mouth/Throat: Oropharynx is clear and moist. No oropharyngeal exudate.  Eyes: Conjunctivae are normal. Right eye exhibits no discharge. Left eye exhibits no discharge. No scleral icterus.  Neck: Normal range of motion. Neck supple. No JVD present. No thyromegaly present.  Cardiovascular: Normal rate, regular rhythm and intact distal pulses.  Exam reveals no gallop and no friction rub.   No murmur heard. Pulmonary/Chest: Effort normal and breath sounds normal. No respiratory distress. She has no wheezes. She has no rales. She exhibits no tenderness.  Abdominal: Soft. Bowel sounds are normal. She exhibits no distension and no mass. There is no tenderness. There is no rebound and no guarding.  Musculoskeletal: Normal range of motion. She exhibits no edema, tenderness or deformity.  Lymphadenopathy:  She has no cervical adenopathy.  Neurological: She is alert and oriented to person, place, and time.  Skin: Skin is warm and dry. No rash noted. She is not diaphoretic. No erythema. No pallor.  Psychiatric: Her speech is normal and behavior is normal. Judgment and thought content normal. Her mood appears anxious. Cognition and memory are normal. She does not exhibit a  depressed mood.  Vitals reviewed.   Lab Results  Component Value Date   WBC 5.9 03/03/2017   HGB 12.7 03/03/2017   HCT 37.8 03/03/2017   PLT 222.0 03/03/2017   GLUCOSE 88 03/03/2017   CHOL 189 11/24/2015   TRIG 86.0 11/24/2015   HDL 66.70 11/24/2015   LDLCALC 105 (H) 11/24/2015   ALT 11 03/03/2017   AST 21 03/03/2017   NA 132 (L) 03/03/2017   K 4.4 03/03/2017   CL 96 03/03/2017   CREATININE 0.90 03/03/2017   BUN 23 03/03/2017   CO2 25 03/03/2017   TSH 4.00 03/03/2017    No results found.  Assessment & Plan:   Rachel Ferguson was seen today for pruritis.  Diagnoses and all orders for this visit:  Other specified hypothyroidism- Her TSH is in the normal range. She will remain on the current dose of levothyroxine. -     TSH; Future  Severe itching- her skin exam is unremarkable. Her lab work is normal with the exception of a slightly low sodium. I've asked her to try the hydroxyzine. If the itching doesn't resolve that I've asked her to return for follow-up. She may need to undergo CT scan of the chest and abdomen to screen for occult lymphoma. -     CBC with Differential/Platelet; Future -     Comprehensive metabolic panel; Future -     Sedimentation rate; Future -     C-reactive protein; Future  Essential hypertension- her blood pressure is well controlled. Electrolytes and renal function are normal. -     CBC with Differential/Platelet; Future -     Comprehensive metabolic panel; Future   I am having Rachel Ferguson maintain her omeprazole, amLODipine, mirtazapine, and levothyroxine.  No orders of the defined types were placed in this encounter.    Follow-up: Return in about 2 weeks (around 03/17/2017).  Sanda Linger, MD

## 2017-03-03 NOTE — Patient Instructions (Signed)
Pruritus  Pruritus is an itching feeling. There are many different conditions and factors that can make your skin itchy. Dry skin is one of the most common causes of itching. Most cases of itching do not require medical attention. Itchy skin can turn into a rash.  Follow these instructions at home:  Watch your pruritus for any changes. Take these steps to help with your condition:  Skin Care  · Moisturize your skin as needed. A moisturizer that contains petroleum jelly is best for keeping moisture in your skin.  · Take or apply medicines only as directed by your health care provider. This may include:  ? Corticosteroid cream.  ? Anti-itch lotions.  ? Oral anti-histamines.  · Apply cool compresses to the affected areas.  · Try taking a bath with:  ? Epsom salts. Follow the instructions on the packaging. You can get these at your local pharmacy or grocery store.  ? Baking soda. Pour a small amount into the bath as directed by your health care provider.  ? Colloidal oatmeal. Follow the instructions on the packaging. You can get this at your local pharmacy or grocery store.  · Try applying baking soda paste to your skin. Stir water into baking soda until it reaches a paste-like consistency.  · Do not scratch your skin.  · Avoid hot showers or baths, which can make itching worse. A cold shower may help with itching as long as you use a moisturizer after.  · Avoid scented soaps, detergents, and perfumes. Use gentle soaps, detergents, perfumes, and other cosmetic products.  General instructions  · Avoid wearing tight clothes.  · Keep a journal to help track what causes your itch. Write down:  ? What you eat.  ? What cosmetic products you use.  ? What you drink.  ? What you wear. This includes jewelry.  · Use a humidifier. This keeps the air moist, which helps to prevent dry skin.  Contact a health care provider if:  · The itching does not go away after several days.  · You sweat at night.  · You have weight loss.  · You  are unusually thirsty.  · You urinate more than normal.  · You are more tired than normal.  · You have abdominal pain.  · Your skin tingles.  · You feel weak.  · Your skin or the whites of your eyes look yellow (jaundice).  · Your skin feels numb.  This information is not intended to replace advice given to you by your health care provider. Make sure you discuss any questions you have with your health care provider.  Document Released: 02/10/2011 Document Revised: 11/06/2015 Document Reviewed: 05/27/2014  Elsevier Interactive Patient Education © 2018 [employer]

## 2017-03-04 LAB — COMPREHENSIVE METABOLIC PANEL
ALT: 11 U/L (ref 0–35)
AST: 21 U/L (ref 0–37)
Albumin: 4.2 g/dL (ref 3.5–5.2)
Alkaline Phosphatase: 68 U/L (ref 39–117)
BUN: 23 mg/dL (ref 6–23)
CHLORIDE: 96 meq/L (ref 96–112)
CO2: 25 mEq/L (ref 19–32)
Calcium: 9.3 mg/dL (ref 8.4–10.5)
Creatinine, Ser: 0.9 mg/dL (ref 0.40–1.20)
GFR: 63.31 mL/min (ref >60.00)
GLUCOSE: 88 mg/dL (ref 70–99)
POTASSIUM: 4.4 meq/L (ref 3.5–5.1)
SODIUM: 132 meq/L — AB (ref 135–145)
Total Bilirubin: 0.5 mg/dL (ref 0.2–1.2)
Total Protein: 6.9 g/dL (ref 6.0–8.3)

## 2017-03-04 LAB — C-REACTIVE PROTEIN: CRP: 0.3 mg/dL — ABNORMAL LOW (ref 0.5–20.0)

## 2017-03-10 ENCOUNTER — Ambulatory Visit (INDEPENDENT_AMBULATORY_CARE_PROVIDER_SITE_OTHER): Payer: Medicare Other | Admitting: Psychology

## 2017-03-10 ENCOUNTER — Encounter: Payer: Self-pay | Admitting: Psychology

## 2017-03-10 DIAGNOSIS — R413 Other amnesia: Secondary | ICD-10-CM

## 2017-03-10 DIAGNOSIS — R4189 Other symptoms and signs involving cognitive functions and awareness: Secondary | ICD-10-CM

## 2017-03-10 DIAGNOSIS — R4689 Other symptoms and signs involving appearance and behavior: Secondary | ICD-10-CM

## 2017-03-10 NOTE — Progress Notes (Signed)
   Neuropsychology Note  Rachel Ferguson came in today for 1 hour of neuropsychological testing with technician, Wallace Keller, BS, under the supervision of Dr. Elvis Coil. The patient did not appear overtly distressed by the testing session, per behavioral observation or via self-report to the technician. Rest breaks were offered. Rachel Ferguson will return within 2 weeks for a feedback session with Dr. Alinda Dooms at which time her test performances, clinical impressions and treatment recommendations will be reviewed in detail. The patient understands she can contact our office should she require our assistance before this time.  Full report to follow.
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
## 2017-03-16 ENCOUNTER — Ambulatory Visit (INDEPENDENT_AMBULATORY_CARE_PROVIDER_SITE_OTHER): Payer: Medicare Other | Admitting: Internal Medicine

## 2017-03-16 ENCOUNTER — Encounter: Payer: Self-pay | Admitting: Internal Medicine

## 2017-03-16 DIAGNOSIS — L299 Pruritus, unspecified: Secondary | ICD-10-CM | POA: Diagnosis not present

## 2017-03-16 MED ORDER — HYDROXYZINE HCL 25 MG PO TABS: 25.0000 mg | ORAL_TABLET | Freq: Three times a day (TID) | ORAL | 3 refills | Status: DC | PRN

## 2017-03-16 NOTE — Patient Instructions (Addendum)
We will have you take zyrtec twice a day to help more with itching.   We have sent in the refill of the hydroxyzine.   Make sure to lotion up at least once a day and try twice a day if you can for the itching.

## 2017-03-16 NOTE — Progress Notes (Signed)
   Subjective:    Patient ID: Rachel Ferguson, female    DOB: May 19, 1931, 83 y.o.   MRN: [id number]  HPI The patient is an 83 YO female coming in for itching. She has seen a dermatologist, another provider at our office, and an allergist. She is having some allergies which are new. Itching started in the last 6 months and is diffuse. She is trying not to itch. She is able to take hydroxyzine for the itching and this is helpful. She is doing better since that time. She is taking allergy medicine daily and wants to know if she could do more to help the allergies. She is wanting to help this so she does not have to itch all the time. No new medicines since the itching started. She denies fevers, chills, weight change.   Review of Systems  Constitutional: Negative.   HENT: Negative.   Respiratory: Negative for cough, chest tightness and shortness of breath.   Cardiovascular: Negative for chest pain, palpitations and leg swelling.  Gastrointestinal: Negative for abdominal distention, abdominal pain, constipation, diarrhea, nausea and vomiting.  Musculoskeletal: Negative.   Skin: Negative.        itching      Objective:   Physical Exam  Constitutional: She is oriented to person, place, and time. She appears well-developed and well-nourished.  HENT:  Head: Normocephalic and atraumatic.  Eyes: EOM are normal.  Neck: Normal range of motion.  Cardiovascular: Normal rate and regular rhythm.   Pulmonary/Chest: Effort normal and breath sounds normal. No respiratory distress. She has no wheezes. She has no rales.  Abdominal: Soft. Bowel sounds are normal. She exhibits no distension. There is no tenderness. There is no rebound.  Musculoskeletal: She exhibits no edema.  Neurological: She is alert and oriented to person, place, and time. Coordination normal.  Skin: Skin is warm and dry.  No rash or stigmata of scratching on exam   Vitals:   03/16/17 1123  BP: 124/68  Pulse: (!) 58  Temp: 97.8 F (36.6  C)  TempSrc: Oral  SpO2: 100%  Weight: 131 lb (59.4 kg)  Height:  (1.651 m)      Assessment & Plan:

## 2017-03-17 ENCOUNTER — Telehealth: Payer: Self-pay | Admitting: Internal Medicine

## 2017-03-17 NOTE — Telephone Encounter (Signed)
[facility] called stating that hydrOXYzine (ATARAX/VISTARIL) 25 MG tablet is requiring a prior authorization.

## 2017-03-17 NOTE — Telephone Encounter (Signed)
PA started on CoverMyMeds KEY: Key: [id number] If patient calls back this is what it told me when I sent to plan that way she will know too [facility] is reviewing your PA request. Typically an electronic response will be received within 72 hours

## 2017-03-17 NOTE — Telephone Encounter (Signed)
Yes I have the PA I will be starting it soon

## 2017-03-18 ENCOUNTER — Encounter: Payer: Self-pay | Admitting: Internal Medicine

## 2017-03-18 MED ORDER — HYDROXYZINE HCL 25 MG PO TABS: 25.0000 mg | ORAL_TABLET | Freq: Three times a day (TID) | ORAL | 3 refills | Status: DC | PRN

## 2017-03-18 NOTE — Telephone Encounter (Signed)
Received fax this morning that Rx was denied. Send in full script and she will have to pay out of pocket for medication. Patient states she does not mind paying for the medication she just wants the medicine

## 2017-03-18 NOTE — Telephone Encounter (Signed)
Pt walk in stating she is needing a short term supply sent to her local [facility] pharmacy. MD sent 90 day script [facility]p [facility], but per chart its requiring a PA, which MD assistant has started. Inform will send week supply to local pharmacy, but if insurance decline she is ware that she will have to pay out of  Pocket...Rachel Ferguson

## 2017-03-18 NOTE — Telephone Encounter (Signed)
Rx already sent to local pharmacy...[name]

## 2017-03-18 NOTE — Assessment & Plan Note (Addendum)
Advised to increase zyrtec to BID dosing for the itching. Can still use hydroxyzine for itching up to BID prn. Refilled that today. Has had adequate evaluation for the itching and no red flags today. Will monitor.
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
## 2017-03-24 ENCOUNTER — Encounter: Payer: Self-pay | Admitting: Psychology

## 2017-03-24 ENCOUNTER — Ambulatory Visit (INDEPENDENT_AMBULATORY_CARE_PROVIDER_SITE_OTHER): Payer: Medicare Other | Admitting: Psychology

## 2017-03-24 DIAGNOSIS — G301 Alzheimer's disease with late onset: Secondary | ICD-10-CM | POA: Diagnosis not present

## 2017-03-24 DIAGNOSIS — F0280 Dementia in other diseases classified elsewhere without behavioral disturbance: Secondary | ICD-10-CM | POA: Diagnosis not present

## 2017-03-24 NOTE — Patient Instructions (Signed)
Biomedical engineer Just 1 Navigator

## 2017-03-26 ENCOUNTER — Telehealth: Payer: Self-pay | Admitting: Internal Medicine

## 2017-03-26 NOTE — Telephone Encounter (Signed)
Pt walked in wanting to leave a note regarding medication of hydrOXYzine (ATARAX/VISTARIL) 25 MG tablet stating that the medication is not good for the memory that's what the pt allergist told her. Pt feels like she feeling sharp before and now feeling foggy like in her brain. Please advise?  Call pt @ [phone]. Thank you!

## 2017-03-26 NOTE — Telephone Encounter (Signed)
FYI: Spoke with patient about the Hydroxyzine. Patient stopped the medication 2 days ago. She saw her Allergist and advised to stop the medication. She was given something else for itching. She was advised it will take some time for the medication to get out of her system. Advised that someone will call her next week to check on her to see if the fogginess improves.

## 2017-03-28 ENCOUNTER — Encounter: Payer: Self-pay | Admitting: Neurology

## 2017-03-28 ENCOUNTER — Ambulatory Visit (INDEPENDENT_AMBULATORY_CARE_PROVIDER_SITE_OTHER): Payer: Medicare Other | Admitting: Neurology

## 2017-03-28 VITALS — BP 140/80 | HR 76 | Ht 65.0 in | Wt 125.2 lb

## 2017-03-28 DIAGNOSIS — G301 Alzheimer's disease with late onset: Secondary | ICD-10-CM

## 2017-03-28 DIAGNOSIS — F0280 Dementia in other diseases classified elsewhere without behavioral disturbance: Secondary | ICD-10-CM | POA: Diagnosis not present

## 2017-03-28 MED ORDER — DONEPEZIL HCL 10 MG PO TABS: ORAL_TABLET | ORAL | 11 refills | Status: DC

## 2017-03-28 NOTE — Progress Notes (Signed)
NEUROLOGY FOLLOW UP OFFICE NOTE  Rachel Ferguson [id number] Apr 27, 1931  HISTORY OF PRESENT ILLNESS: I had the pleasure of seeing Kavita Bartl in follow-up in the neurology clinic on 03/28/2017. She is accompanied by her son who helps supplement the history today. The patient was last seen 3 months ago due to concern for worsening memory with behavioral changes. Her MOCA score in the office was 27/30, however history provided by family was concerning for dementia. 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 Per Dr. Nigel Sloop note: "Specific areas of cognitive impairment included semantic retrieval, comprehension of complex auditory material, and executive functioning (mental flexibility, set shifting, clock drawing). While memory consolidation was intact, the amount of information that could be encoded was reduced. The patient's level of cognitive impairment, alongside changes in daily functioning (e.g., driving, management of finances, appointments), meet diagnostic criteria for a dementia syndrome. Of note, because of her above-average intellectual abilities, her testing results may even over-estimate her true abilities. I would still characterize stage of dementia as mild, approaching moderate." Taken together with cognitive profile concerning for medial temporal lobe involvement and MRI showing only mild small vessel disease, results support a diagnosis of Alzheimer's disease.   She presents today to discuss results and medications. She reports that she had been prescribed hydroxyzine from September to October, and that she was told by her allergist that this can cause memory problems. She stopped it a week ago, but feels that it made her perform less optimally during the Neuropsych testing. She feels it is still in her body, sometimes she sleeps a lot, which is unusual for her. She currently lives with  her son who now manages her medications. She has not been driving much, they are awaiting word from [facility] regarding evaluation. She has diarrhea around 1-2 times a week. She denies any headaches, dizziness, diplopia, focal numbness/tingling/weakness, no recent falls.   HPI 01/03/17: This is an 83 yo RH woman with a history of hypertension, hypothyroidism, depression, who presented for evaluation of dizziness and word-finding difficulties. She was admitted to [facility] in June 2065for dizziness worse with movements. She had an MRI brain that was reportedly normal. TSH normal. She drove herself to her hair appointment and as she walked in, she had another spell where she felt she had to lay down. She lay down and noticed her heart rate on the Apple watch was 158 bpm. Symptoms lasted a few minutes, she called her PCP who instructed her to go to the ER. She kept repeating during the visit that it was a big mistake to go to the ER. During her hospitalization, it was noted that she became extremely combative, yelling at nursing staff and physicians, stating she just wanted to sleep and be left alone. She could not comprehend why testing was needed to be done for the dizziness. She finally agreed to an echocardiogram, which was normal, EF 60-65%. There was no arrhythmia on telemetry for over 12 hours, it was agreed to discharge her home and had an outpatient event monitor. She has refused this and states she has not had any further dizziness or other issues. She was taken off her BP medication on last PCP visit. She does recall her hospital stay, saying her head was not quite right, not spinning, "like a funny feeling," no headache, tinnitus, nausea/vomiting, focal numbness/tingling. She states "I wish I had not gone, I'm sorry I did." She reports BP has  been better, she has been travelling and feels normal. She reports her memory "could be better," she does not she forgets events but had a strenuous trip. She lives with her son  and grandson, who are not present today. According to her daughter, she started noticing gradual memory decline since the patient's spouse passed away 6 years ago. She loses her phone all the time. She forgot her medications maybe once. Her daughter reports she has lost her car several times, the patient gets upset and reports it has only happened twice, and there was a reason for them. One time she could not find her car after seeing her doctor in June. Her other son has had to help her find her car a few times. She gets more disoriented driving, especially in [place], where she used to live for many years. She states she has a wonderful GPS. Her children report that if GPS is unclear, she has a problem. Even going to the doctor's office, she could not remember exactly where it was. She has good and bad days. She got upset when her daughter reported problems with executive functioning, citing an instance with unpacking boxes into their remodeled house. She was "leaving trails" everywhere, she states she was trying to get order out of chaos. Her daughter reports impulsivity and concerns about her judgement. She has never struggled with anxiety, but several times recently, she has gotten panicked. One instance was when she was on the way to the hospital, she was anxious and panicked. She denies any missed bill payments. She denies any headaches, any further dizziness, diplopia, dysarthria/dysphagia, neck/back pain, focal numbness/tingling/weakness, bowel/bladder dysfunction, anosmia, or tremors. No difficulties with ADLs, no hallucinations. Her older sister had dementia. She denies any history of significant head injuries. She drinks at least one glass of red wine most every night.   PAST MEDICAL HISTORY: Past Medical History:  Diagnosis Date  . Colon polyps   . GERD (gastroesophageal reflux disease)   . Migraines   . Osteopenia   . Thyroid disease     MEDICATIONS: Current Outpatient Prescriptions on  File Prior to Visit  Medication Sig Dispense Refill  . amLODipine (NORVASC) 5 MG tablet Take 1 tablet (5 mg total) by mouth daily. (Patient not taking: Reported on 01/03/2017) 90 tablet 3  . hydrOXYzine (ATARAX/VISTARIL) 25 MG tablet Take 1 tablet (25 mg total) by mouth 3 (three) times daily as needed. 90 tablet 3  . levothyroxine (SYNTHROID, LEVOTHROID) 75 MCG tablet TAKE 1 TABLET DAILY BEFORE BREAKFAST 90 tablet 0  . mirtazapine (REMERON) 7.5 MG tablet Take 1 tablet (7.5 mg total) by mouth at bedtime. 14 tablet 0  . omeprazole (PRILOSEC) 20 MG capsule Take 1 capsule (20 mg total) by mouth daily. (Patient taking differently: Take 20 mg by mouth at bedtime. ) 90 capsule 3   No current facility-administered medications on file prior to visit.     ALLERGIES: No Known Allergies  FAMILY HISTORY: Family History  Problem Relation Age of Onset  . Cancer Mother        breast  . Heart disease Father   . Alzheimer's disease Sister   . Heart disease Maternal Grandmother   . Tuberculosis Paternal Grandmother   . Cancer Sister   . Melanoma Sister     SOCIAL HISTORY: Social History   Social History  . Marital status: Widowed    Spouse name: N/A  . Number of children: 3  . Years of education: 34   Occupational History  .  retired Runner, broadcasting/film/video   . Clinical research associate    Social History Main Topics  . Smoking status: Former Smoker    Packs/day: 0.25    Years: 5.00    Quit date: 05/05/1956  . Smokeless tobacco: Never Used  . Alcohol use 8.4 oz/week    14 Standard drinks or equivalent per week     Comment: 1-2 glasses red wine nightly  . Drug use: No  . Sexual activity: Not on file   Other Topics Concern  . Not on file   Social History Narrative   Lives with one of her sons and grandson in a 2 story home but stays on the first floor.  Has 3 children.  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  Education: college.     REVIEW OF SYSTEMS: Constitutional: No fevers, chills, or sweats,  no generalized fatigue, change in appetite Eyes: No visual changes, double vision, eye pain Ear, nose and throat: No hearing loss, ear pain, nasal congestion, sore throat Cardiovascular: No chest pain, palpitations Respiratory:  No shortness of breath at rest or with exertion, wheezes GastrointestinaI: No nausea, vomiting, diarrhea, abdominal pain, fecal incontinence Genitourinary:  No dysuria, urinary retention or frequency Musculoskeletal:  No neck pain, back pain Integumentary: No rash, +pruritus, no skin lesions Neurological: as above Psychiatric: No depression, insomnia, anxiety Endocrine: No palpitations, fatigue, diaphoresis, mood swings, change in appetite, change in weight, increased thirst Hematologic/Lymphatic:  No anemia, purpura, petechiae. Allergic/Immunologic: no itchy/runny eyes, nasal congestion, recent allergic reactions, rashes  PHYSICAL EXAM: Vitals:   03/28/17 1522  BP: 140/80  Pulse: 76  SpO2: 97%   General: No acute distress Head:  Normocephalic/atraumatic Neck: supple, no paraspinal tenderness, full range of motion Heart:  Regular rate and rhythm Lungs:  Clear to auscultation bilaterally Back: No paraspinal tenderness Skin/Extremities: No rash, no edema Neurological Exam: alert and oriented to person, place, and time. No aphasia or dysarthria. Fund of knowledge is appropriate.  Recent and remote memory are intact.  Attention and concentration are normal.    Able to name objects and repeat phrases. Cranial nerves: Pupils equal, round, reactive to light. Extraocular movements intact with no nystagmus. Visual fields full. Facial sensation intact. No facial asymmetry. Tongue, uvula, palate midline.  Motor: Bulk and tone normal, muscle strength 5/5 throughout with no pronator drift.  Sensation to light touch intact.  No extinction to double simultaneous stimulation.  Deep tendon reflexes 2+ throughout, toes downgoing.  Finger to nose testing intact.  Gait narrow-based  and steady, able to tandem walk adequately.  Romberg negative.  IMPRESSION: This is an 83 yo RH woman with a history of hypertension, hypothyroidism, depression, who presented for evaluation of word-finding difficulties and dizziness. She denies any further symptoms. While she was in the hospital, she was very aggressive and concern was raised for underlying dementia. She underwent Neurocognitive evaluation last October 2018 with results showing evidence of mild dementia, most likely due to Alzheimer's disease. Results and recommendations were discussed in the office today. She is agreeable to start Aricept, side effects and expectations from the medication were discussed. She appears convinced that hydroxyzine made her perform less optimally during Neuropsych testing, I discussed with her how Neuropsych testing can actually help determine how much of cognitive changes are due to medication versus underlying neurodegenerative condition, but she was persistent and wanted to speak to her allergist as well. She has been off hydroxyzine, I reassured her this should be out of her system by now. We  discussed other recommendations, including driving evaluation, they are awaiting word from the [facility]. They are working on getting PoA for monitoring her finances. Her son now manages medications. We discussed the importance of control of vascular risk factors, physical exercise, and brain stimulation exercises for brain health. She will follow-up in 6 months and knows to call for any changes.   Thank you for allowing me to participate in her care.  Please do not hesitate to call for any questions or concerns.  The duration of this appointment visit was 25 minutes of face-to-face time with the patient.  Greater than 50% of this time was spent in counseling, explanation of diagnosis, planning of further management, and coordination of care.   Patrcia Dolly, M.D.   CC: Dr. Okey Dupre

## 2017-03-28 NOTE — Patient Instructions (Addendum)
1. Start Donepezil : take 1/2 tablet daily for 1 month, then increase to 1 tablet daily 2. Proceed with DMV driving evaluation 3. Proceed with working on [employer] paperwork 4. Follow-up in 6 months, call for any changes  FALL PRECAUTIONS: Be cautious when walking. Scan the area for obstacles that may increase the risk of trips and falls. When getting up in the mornings, sit up at the edge of the bed for a few minutes before getting out of bed. Consider elevating the bed at the head end to avoid drop of blood pressure when getting up. Walk always in a well-lit room (use night lights in the walls). Avoid area rugs or power cords from appliances in the middle of the walkways. Use a walker or a cane if necessary and consider physical therapy for balance exercise. Get your eyesight checked regularly.  FINANCIAL OVERSIGHT: Supervision, especially oversight when making financial decisions or transactions is also recommended.  HOME SAFETY: Consider the safety of the kitchen when operating appliances like stoves, microwave oven, and blender. Consider having supervision and share cooking responsibilities until no longer able to participate in those. Accidents with firearms and other hazards in the house should be identified and addressed as well.  DRIVING: Regarding driving, in patients with progressive memory problems, driving will be impaired. We advise to have someone else do the driving if trouble finding directions or if minor accidents are reported. Independent driving assessment is available to determine safety of driving.  ABILITY TO BE LEFT ALONE: If patient is unable to contact 911 operator, consider using LifeLine, or when the need is there, arrange for someone to stay with patients. Smoking is a fire hazard, consider supervision or cessation. Risk of wandering should be assessed by caregiver and if detected at any point, supervision and safe proof recommendations should be instituted.  MEDICATION  SUPERVISION: Inability to self-administer medication needs to be constantly addressed. Implement a mechanism to ensure safe administration of the medications.  RECOMMENDATIONS FOR ALL PATIENTS WITH MEMORY PROBLEMS: 1. Continue to exercise (Recommend 30 minutes of walking everyday, or 3 hours every week) 2. Increase social interactions - continue going to [place] and enjoy social gatherings with friends and family 3. Eat healthy, avoid fried foods and eat more fruits and vegetables 4. Maintain adequate blood pressure, blood sugar, and blood cholesterol level. Reducing the risk of stroke and cardiovascular disease also helps promoting better memory. 5. Avoid stressful situations. Live a simple life and avoid aggravations. Organize your time and prepare for the next day in anticipation. 6. Sleep well, avoid any interruptions of sleep and avoid any distractions in the bedroom that may interfere with adequate sleep quality 7. Avoid sugar, avoid sweets as there is a strong link between excessive sugar intake, diabetes, and cognitive impairment We discussed the Mediterranean diet, which has been shown to help patients reduce the risk of progressive memory disorders and reduces cardiovascular risk. This includes eating fish, eat fruits and [place] leafy vegetables, nuts like almonds and hazelnuts, walnuts, and also use olive oil. Avoid fast foods and fried foods as much as possible. Avoid sweets and sugar as sugar use has been linked to worsening of memory function.  There is always a concern of gradual progression of memory problems. If this is the case, then we may need to adjust level of care according to patient needs. Support, both to the patient and caregiver, should then be put into place.

## 2017-03-29 ENCOUNTER — Encounter: Payer: Self-pay | Admitting: Neurology

## 2017-03-29 DIAGNOSIS — G301 Alzheimer's disease with late onset: Principal | ICD-10-CM

## 2017-03-29 DIAGNOSIS — F0280 Dementia in other diseases classified elsewhere without behavioral disturbance: Secondary | ICD-10-CM | POA: Insufficient documentation

## 2017-03-31 ENCOUNTER — Telehealth: Payer: Self-pay | Admitting: Internal Medicine

## 2017-03-31 MED ORDER — MIRTAZAPINE 7.5 MG PO TABS: 7.5000 mg | ORAL_TABLET | Freq: Every day | ORAL | 1 refills | Status: DC

## 2017-03-31 NOTE — Telephone Encounter (Signed)
Reviewed chart pt is up-to-date sent refills to requested pharmacy.../lmb  

## 2017-03-31 NOTE — Telephone Encounter (Signed)
mirtazapine (REMERON) 7.5 MG tablet   [facility] [place] [address] [phone] (Phone) [phone] (Fax)   Patient is requesting a refill on this medication. Please advise.

## 2017-04-07 ENCOUNTER — Encounter: Payer: Medicare Other | Admitting: Psychology

## 2017-04-20 ENCOUNTER — Encounter: Payer: Self-pay | Admitting: Internal Medicine

## 2017-04-20 ENCOUNTER — Ambulatory Visit (INDEPENDENT_AMBULATORY_CARE_PROVIDER_SITE_OTHER): Payer: Medicare Other | Admitting: Internal Medicine

## 2017-04-20 DIAGNOSIS — F0280 Dementia in other diseases classified elsewhere without behavioral disturbance: Secondary | ICD-10-CM

## 2017-04-20 DIAGNOSIS — G301 Alzheimer's disease with late onset: Secondary | ICD-10-CM | POA: Diagnosis not present

## 2017-04-20 NOTE — Assessment & Plan Note (Signed)
Will fill out DMV forms recommending road test. If passed should be re-evaluated medically periodically for safety.

## 2017-04-20 NOTE — Progress Notes (Signed)
   Subjective:    Patient ID: Rachel Ferguson, female    DOB: 07/20/1930, 83 y.o.   MRN: [id number]  HPI The patient is an 83 YO female coming in for follow up of her itching (stopped hydroxyzine due to some adverse side effects, itching is now gone, she has changed her diet some, no change to soap or shampoo) as well as her memory (she feels that the testing was done while on medication, saw neurology and they did not feel this was related, she is asked to do DMV evaluation for safety with her mild to moderate dementia, recently was asked to start aricept).   Review of Systems  Constitutional: Negative.   HENT: Negative.   Eyes: Negative.   Respiratory: Negative for cough, chest tightness and shortness of breath.   Cardiovascular: Negative for chest pain, palpitations and leg swelling.  Gastrointestinal: Negative for abdominal distention, abdominal pain, constipation, diarrhea, nausea and vomiting.  Musculoskeletal: Negative.   Skin: Negative.   Neurological: Negative.   Psychiatric/Behavioral: Negative.       Objective:   Physical Exam  Constitutional: She is oriented to person, place, and time. She appears well-developed and well-nourished.  HENT:  Head: Normocephalic and atraumatic.  Eyes: EOM are normal.  Neck: Normal range of motion.  Cardiovascular: Normal rate and regular rhythm.  Pulmonary/Chest: Effort normal and breath sounds normal. No respiratory distress. She has no wheezes. She has no rales.  Abdominal: Soft. Bowel sounds are normal. She exhibits no distension. There is no tenderness. There is no rebound.  Musculoskeletal: She exhibits no edema.  Neurological: She is alert and oriented to person, place, and time. Coordination normal.  Skin: Skin is warm and dry.  Psychiatric: She has a normal mood and affect.   Vitals:   04/20/17 0833  BP: 132/80  Pulse: 60  Temp: 97.6 F (36.4 C)  TempSrc: Oral  SpO2: 100%  Weight: 126 lb (57.2 kg)  Height: 5\' 5"  (1.651 m)     Assessment & Plan:

## 2017-05-02 ENCOUNTER — Ambulatory Visit: Payer: Medicare Other | Admitting: Neurology

## 2017-05-13 ENCOUNTER — Telehealth: Payer: Self-pay | Admitting: Neurology

## 2017-05-13 NOTE — Telephone Encounter (Signed)
Pt left a voicemail message saying she is ready to take her driving test but needs help on who to contact and how to go about it

## 2017-05-17 NOTE — Telephone Encounter (Signed)
error 

## 2017-05-26 ENCOUNTER — Other Ambulatory Visit: Payer: Self-pay | Admitting: Internal Medicine

## 2017-06-17 ENCOUNTER — Telehealth: Payer: Self-pay | Admitting: Neurology

## 2017-06-17 NOTE — Telephone Encounter (Signed)
Pt called and said she feels Dr Karel JarvisAquino gave her a wrong diagnosis of having alzheimers, please call and advise

## 2017-06-17 NOTE — Telephone Encounter (Signed)
Pt returned my call.  She states that the [facility] is requiring her to take 2 driving tests. She passed the test here in [place], and was told that she needed to take a second test in another city.  (when asked which city pt states that she can pick from a list of 5 but did not name a city)  Pt asked why she is required to take 2 tests.  Let pt know that I do not know.... Our office has no control of how the DMV operates.  Pt then asked who would be at fault if she were to have an accident while driving to her second driving test - her, the [facility] or Dr. Karel JarvisAquino.  I explained that it would be either her or the person she has an accident with. No fault would be on Dr. Karel JarvisAquino, [facility] nor the [facility].  Pt went on to state that she believes that she was misdiagnosed.  Mentioning that she is currently working on a book (could not recall the name of the book), she does cross-words puzzles, and algebra to help keep her brain sharpe.  She expressed that she would like to have the ALZ Dx removed from her chart so she "would no longer have this label attached to her".  I explained that I do not have the authority to do that.  That was about the time that pt realized that she was not speaking with Dr. Karel JarvisAquino.  She expressed that she was under the impression that since I had returned her call, I "should have been Dr. Karel JarvisAquino."  Pt then restated her concerns about driving to second driving test.  She went on to say "since my diagnoses [place], driven to [place] beach many times.  Now you tell me if that sounds like someone who has alzheimer's."  I let pt know that she is more than welcome to obtain second opinion.  She stated that she would like one.

## 2017-06-17 NOTE — Telephone Encounter (Signed)
Returned call.  No answer.  LMOM asking for return call.  

## 2017-06-28 ENCOUNTER — Telehealth: Payer: Self-pay | Admitting: Neurology

## 2017-06-28 NOTE — Telephone Encounter (Signed)
Pt called and said she stopped taking the medication for alzheimers and feels she does not have alzheimers and she is writing a book, they are wanting her to take drivers test in another city, she wants to get rid of the title of having alzheimers because she is positive she does not have alzheimers

## 2017-06-28 NOTE — Telephone Encounter (Signed)
Appointment made for tomorrow, Jan 16th at 10:00am.  Spoke with pt's son, Jonny RuizJohn, he will be present for appointment.

## 2017-06-29 ENCOUNTER — Ambulatory Visit (INDEPENDENT_AMBULATORY_CARE_PROVIDER_SITE_OTHER): Payer: Medicare Other | Admitting: Neurology

## 2017-06-29 ENCOUNTER — Encounter: Payer: Self-pay | Admitting: Neurology

## 2017-06-29 VITALS — BP 140/86 | HR 67 | Ht 65.0 in | Wt 129.0 lb

## 2017-06-29 DIAGNOSIS — G301 Alzheimer's disease with late onset: Secondary | ICD-10-CM | POA: Diagnosis not present

## 2017-06-29 DIAGNOSIS — F0280 Dementia in other diseases classified elsewhere without behavioral disturbance: Secondary | ICD-10-CM

## 2017-06-29 NOTE — Patient Instructions (Signed)
1. Refer to [facility] for Neuropsychological testing second opinion 2. Follow-up in 6 months  FALL PRECAUTIONS: Be cautious when walking. Scan the area for obstacles that may increase the risk of trips and falls. When getting up in the mornings, sit up at the edge of the bed for a few minutes before getting out of bed. Consider elevating the bed at the head end to avoid drop of blood pressure when getting up. Walk always in a well-lit room (use night lights in the walls). Avoid area rugs or power cords from appliances in the middle of the walkways. Use a walker or a cane if necessary and consider physical therapy for balance exercise. Get your eyesight checked regularly.  FINANCIAL OVERSIGHT: Supervision, especially oversight when making financial decisions or transactions is also recommended.  HOME SAFETY: Consider the safety of the kitchen when operating appliances like stoves, microwave oven, and blender. Consider having supervision and share cooking responsibilities until no longer able to participate in those. Accidents with firearms and other hazards in the house should be identified and addressed as well.  DRIVING: Regarding driving, in patients with progressive memory problems, driving will be impaired. We advise to have someone else do the driving if trouble finding directions or if minor accidents are reported. Independent driving assessment is available to determine safety of driving.  ABILITY TO BE LEFT ALONE: If patient is unable to contact 911 operator, consider using LifeLine, or when the need is there, arrange for someone to stay with patients. Smoking is a fire hazard, consider supervision or cessation. Risk of wandering should be assessed by caregiver and if detected at any point, supervision and safe proof recommendations should be instituted.  MEDICATION SUPERVISION: Inability to self-administer medication needs to be constantly addressed. Implement a mechanism to ensure safe  administration of the medications.  RECOMMENDATIONS FOR ALL PATIENTS WITH MEMORY PROBLEMS: 1. Continue to exercise (Recommend 30 minutes of walking everyday, or 3 hours every week) 2. Increase social interactions - continue going to [place] and enjoy social gatherings with friends and family 3. Eat healthy, avoid fried foods and eat more fruits and vegetables 4. Maintain adequate blood pressure, blood sugar, and blood cholesterol level. Reducing the risk of stroke and cardiovascular disease also helps promoting better memory. 5. Avoid stressful situations. Live a simple life and avoid aggravations. Organize your time and prepare for the next day in anticipation. 6. Sleep well, avoid any interruptions of sleep and avoid any distractions in the bedroom that may interfere with adequate sleep quality 7. Avoid sugar, avoid sweets as there is a strong link between excessive sugar intake, diabetes, and cognitive impairment We discussed the Mediterranean diet, which has been shown to help patients reduce the risk of progressive memory disorders and reduces cardiovascular risk. This includes eating fish, eat fruits and [place]ordan leafy vegetables, nuts like almonds and hazelnuts, walnuts, and also use olive oil. Avoid fast foods and fried foods as much as possible. Avoid sweets and sugar as sugar use has been linked to worsening of memory function.  There is always a concern of gradual progression of memory problems. If this is the case, then we may need to adjust level of care according to patient needs. Support, both to the patient and caregiver, should then be put into place.

## 2017-06-29 NOTE — Progress Notes (Signed)
NEUROLOGY FOLLOW UP OFFICE NOTE  Rachel Ferguson [id number] 10-Oct-1938  HISTORY OF PRESENT ILLNESS: I had the pleasure of seeing Rachel Ferguson in follow-up in the neurology clinic on 06/29/2017. She is again accompanied by her son who helps supplement the history today. The patient was last seen 3 months ago due to concern for worsening memory with behavioral changes. 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 We had discussed the diagnosis on her last visit. She expressed concern that she did not do well because she was taking hydroxyzine at that time. She asked for an earlier visit today to ask that the diagnosis of "dementia" be taken off her chart, because it is ruining her life. She is trying to write a book, but has been going through so many places to have the driving evaluation done, which is frustrating her. She reports that she initially got a letter to take a road test. She then got a letter asking for a physical exam. She went to her PCP and had a physical, then a few days before her road test, she got another letter saying she needed to a a cognitive driving test, and the closest this would be is at [facility]. She had a driving test at the local [facility] and passed it, they asked the instructor about the other driving test and were instructed to call [place]. Her son states that they were told that "when the neurologist filled out paperwork requesting test, boxes were left blank, so it was left to them to have this evaluated with cognitive driving test." She then shows the last letter she received on 06/22/17 stating that she does have cognitive deficits and that she is restricted from driving at night and that she needs an updated driver's license detailing this restriction. She had taken the Aricept, but states that after doing her research on [employer], stopped the medication.  HPI 01/03/17: This is an 84 yo  RH woman with a history of hypertension, hypothyroidism, depression, who presented for evaluation of dizziness and word-finding difficulties. She was admitted to [facility] in June 2067for dizziness worse with movements. She had an MRI brain that was reportedly normal. TSH normal. She drove herself to her hair appointment and as she walked in, she had another spell where she felt she had to lay down. She lay down and noticed her heart rate on the Apple watch was 158 bpm. Symptoms lasted a few minutes, she called her PCP who instructed her to go to the ER. She kept repeating during the visit that it was a big mistake to go to the ER. During her hospitalization, it was noted that she became extremely combative, yelling at nursing staff and physicians, stating she just wanted to sleep and be left alone. She could not comprehend why testing was needed to be done for the dizziness. She finally agreed to an echocardiogram, which was normal, EF 60-65%. There was no arrhythmia on telemetry for over 12 hours, it was agreed to discharge her home and had an outpatient event monitor. She has refused this and states she has not had any further dizziness or other issues. She was taken off her BP medication on last PCP visit. She does recall her hospital stay, saying her head was not quite right, not spinning, "like a funny feeling," no headache, tinnitus, nausea/vomiting, focal numbness/tingling. She states "I wish I had not gone, I'm sorry I did." She reports BP has  been better, she has been travelling and feels normal. She reports her memory "could be better," she does not she forgets events but had a strenuous trip. She lives with her son and grandson, who are not present today. According to her daughter, she started noticing gradual memory decline since the patient's spouse passed away 6 years ago. She loses her phone all the time. She forgot her medications maybe once. Her daughter reports she has lost her car several times, the  patient gets upset and reports it has only happened twice, and there was a reason for them. One time she could not find her car after seeing her doctor in June. Her other son has had to help her find her car a few times. She gets more disoriented driving, especially in [place], where she used to live for many years. She states she has a wonderful GPS. Her children report that if GPS is unclear, she has a problem. Even going to the doctor's office, she could not remember exactly where it was. She has good and bad days. She got upset when her daughter reported problems with executive functioning, citing an instance with unpacking boxes into their remodeled house. She was "leaving trails" everywhere, she states she was trying to get order out of chaos. Her daughter reports impulsivity and concerns about her judgement. She has never struggled with anxiety, but several times recently, she has gotten panicked. One instance was when she was on the way to the hospital, she was anxious and panicked. She denies any missed bill payments. She denies any headaches, any further dizziness, diplopia, dysarthria/dysphagia, neck/back pain, focal numbness/tingling/weakness, bowel/bladder dysfunction, anosmia, or tremors. No difficulties with ADLs, no hallucinations. Her older sister had dementia. She denies any history of significant head injuries. She drinks at least one glass of red wine most every night.   Diagnostic Data: Neuropsychological report indicates "Specific areas of cognitive impairment included semantic retrieval, comprehension of complex auditory material, and executive functioning (mental flexibility, set shifting, clock drawing). While memory consolidation was intact, the amount of information that could be encoded was reduced. The patient's level of cognitive impairment, alongside changes in daily functioning (e.g., driving, management of finances, appointments), meet diagnostic criteria for a dementia syndrome.  Of note, because of her above-average intellectual abilities, her testing results may even over-estimate her true abilities. I would still characterize stage of dementia as mild, approaching moderate." Taken together with cognitive profile concerning for medial temporal lobe involvement and MRI showing only mild small vessel disease, results support a diagnosis of Alzheimer's disease.   PAST MEDICAL HISTORY: Past Medical History:  Diagnosis Date  . Colon polyps   . GERD (gastroesophageal reflux disease)   . Migraines   . Osteopenia   . Thyroid disease     MEDICATIONS: Current Outpatient Medications on File Prior to Visit  Medication Sig Dispense Refill  . donepezil (ARICEPT) 10 MG tablet Take 1/2 tablet daily for 1 month, then increase to 1 tablet daily 30 tablet 11  . levothyroxine (SYNTHROID, LEVOTHROID) 75 MCG tablet TAKE 1 TABLET DAILY BEFORE BREAKFAST 90 tablet 2  . mirtazapine (REMERON) 7.5 MG tablet Take 1 tablet (7.5 mg total) by mouth at bedtime. 90 tablet 1  . omeprazole (PRILOSEC) 20 MG capsule Take 1 capsule (20 mg total) by mouth daily. (Patient taking differently: Take 20 mg by mouth at bedtime. ) 90 capsule 3   No current facility-administered medications on file prior to visit.     ALLERGIES: Allergies  Allergen  Reactions  . Ciprofloxacin Other (See Comments)    Had knee pain after taking Cipro. Had knee pain after taking Cipro. Other reaction(s): Other (See Comments) Had knee pain after taking Cipro.    FAMILY HISTORY: Family History  Problem Relation Age of Onset  . Cancer Mother        breast  . Heart disease Father   . Alzheimer's disease Sister   . Heart disease Maternal Grandmother   . Tuberculosis Paternal Grandmother   . Cancer Sister   . Melanoma Sister     SOCIAL HISTORY: Social History   Socioeconomic History  . Marital status: Widowed    Spouse name: Not on file  . Number of children: 3  . Years of education: 57  . Highest education  level: Not on file  Social Needs  . Financial resource strain: Not on file  . Food insecurity - worry: Not on file  . Food insecurity - inability: Not on file  . Transportation needs - medical: Not on file  . Transportation needs - non-medical: Not on file  Occupational History  . Occupation: retired Runner, broadcasting/film/video  . Occupation: Clinical research associate  Tobacco Use  . Smoking status: Former Smoker    Packs/day: 0.25    Years: 5.00    Pack years: 1.25    Last attempt to quit: 05/05/1956    Years since quitting: 61.1  . Smokeless tobacco: Never Used  Substance and Sexual Activity  . Alcohol use: Yes    Alcohol/week: 8.4 oz    Types: 14 Standard drinks or equivalent per week    Comment: 1-2 glasses red wine nightly  . Drug use: No  . Sexual activity: Not on file  Other Topics Concern  . Not on file  Social History Narrative   Lives with one of her sons and grandson in a 2 story home but stays on the first floor.  Has 3 children.  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  Education: college.     REVIEW OF SYSTEMS: Constitutional: No fevers, chills, or sweats, no generalized fatigue, change in appetite Eyes: No visual changes, double vision, eye pain Ear, nose and throat: No hearing loss, ear pain, nasal congestion, sore throat Cardiovascular: No chest pain, palpitations Respiratory:  No shortness of breath at rest or with exertion, wheezes GastrointestinaI: No nausea, vomiting, diarrhea, abdominal pain, fecal incontinence Genitourinary:  No dysuria, urinary retention or frequency Musculoskeletal:  No neck pain, back pain Integumentary: No rash, +pruritus, no skin lesions Neurological: as above Psychiatric: No depression, insomnia, anxiety Endocrine: No palpitations, fatigue, diaphoresis, mood swings, change in appetite, change in weight, increased thirst Hematologic/Lymphatic:  No anemia, purpura, petechiae. Allergic/Immunologic: no itchy/runny eyes, nasal congestion, recent  allergic reactions, rashes  PHYSICAL EXAM: Vitals:   06/29/17 1009  BP: 140/86  Pulse: 67  SpO2: 98%   General: No acute distress Head:  Normocephalic/atraumatic Neck: supple, no paraspinal tenderness, full range of motion Heart:  Regular rate and rhythm Lungs:  Clear to auscultation bilaterally Back: No paraspinal tenderness Skin/Extremities: No rash, no edema Neurological Exam: alert and oriented to person, place, and time. No aphasia or dysarthria. Fund of knowledge is appropriate.  Recent and remote memory are intact.  Attention and concentration are normal.    Able to name objects and repeat phrases. Cranial nerves: Pupils equal, round. No facial asymmetry. Motor: moves all extremities symmetrically. Gait narrow-based and steady.  IMPRESSION: This is an 84 yo RH woman with a history of hypertension,  hypothyroidism, depression, who presented for evaluation of word-finding difficulties and dizziness. She denies any further symptoms. While she was in the hospital, she was very aggressive and concern was raised for underlying dementia. She underwent Neurocognitive evaluation last October 2018 with results showing evidence of mild dementia, most likely due to Alzheimer's disease. She is back in the office for an earlier visit wanting to debate the diagnosis. We again discussed results of Neurocognitive testing, she states that she did her research on Google and asked this provider if I knew more than Google. She is difficult to reason with, and is a highly educated person, and would like a second opinion regarding the diagnosis. She will be scheduled for [facility] at [facility]. She kept ruminating about how the driving tests are ruining her life as she tries to write a book. They will be calling the DMV to clarify what is needed. Her son asked about resuming Aricept, which is what was previously recommended, however since she has done her research and does not want to take  the medication, we can hold off until next Neurocognitive testing results come back. She will follow-up in 6 months and knows to call for any changes.   Thank you for allowing me to participate in her care.  Please do not hesitate to call for any questions or concerns.  The duration of this appointment visit was 25 minutes of face-to-face time with the patient.  Greater than 50% of this time was spent in counseling, explanation of diagnosis, planning of further management, and coordination of care.   Patrcia DollyKaren Coralie Stanke, M.D.   CC: Dr. Okey Duprerawford

## 2017-07-04 ENCOUNTER — Encounter: Payer: Self-pay | Admitting: Neurology

## 2017-07-08 ENCOUNTER — Ambulatory Visit: Payer: Medicare Other | Admitting: Neurology

## 2017-08-10 ENCOUNTER — Telehealth: Payer: Self-pay | Admitting: Internal Medicine

## 2017-08-10 NOTE — Telephone Encounter (Signed)
Copied from CRM [id number]. Topic: General - Other >> Aug 10, 2017 11:01 AM Cecelia ByarsGreen, Baya Lentz L, RMA wrote: Reason for CRM: patient is requesting a call back concerning increasing a medication mirtazapine (REMERON) 7.5 MG tablet

## 2017-08-10 NOTE — Telephone Encounter (Signed)
Called patient back and she stated that all she wanted was to know that if she needs to  would it be okay to take more than the prescribed dosage of medication. When asking further questions patient started getting irritated, but states that every once in awhile she needs to take and extra pill to get sleep. It does not happen often. I told patient that usually if things are going well there is no need for a change and patient said "well I can make an appointment to talk about it" but wanted me to send a message as well

## 2017-08-10 NOTE — Telephone Encounter (Signed)
Okay to take an extra if needed.

## 2017-08-10 NOTE — Telephone Encounter (Signed)
Patient states that she wants Dr. Okey Ferguson to be aware that she does not want to take extra unless she really needs is. But is glad to know that when she does need it she can.

## 2017-08-22 ENCOUNTER — Other Ambulatory Visit: Payer: Self-pay | Admitting: Internal Medicine

## 2017-09-06 ENCOUNTER — Telehealth: Payer: Self-pay | Admitting: Internal Medicine

## 2017-09-06 ENCOUNTER — Other Ambulatory Visit: Payer: Self-pay

## 2017-09-06 ENCOUNTER — Ambulatory Visit: Payer: Medicare Other | Admitting: Internal Medicine

## 2017-09-06 MED ORDER — MIRTAZAPINE 7.5 MG PO TABS: 7.5000 mg | ORAL_TABLET | Freq: Every day | ORAL | 1 refills | Status: DC

## 2017-09-06 NOTE — Telephone Encounter (Signed)
Fine to do 

## 2017-09-06 NOTE — Telephone Encounter (Signed)
[facility]

## 2017-09-06 NOTE — Telephone Encounter (Signed)
Copied from CRM [id number]. Topic: Quick Communication - See Telephone Encounter >> Sep 06, 2017  3:50 PM Lelon FrohlichGolden, Titianna Loomis, [place] wrote: CRM for notification. See Telephone encounter for: 09/06/17.pt called stating that due to her increased use of mirtazapine (REMERON) 7.5 MG tablet  that she needs a [facility] to [facility] her extra usage pt stated that she uses 3 extra per month

## 2017-09-09 ENCOUNTER — Other Ambulatory Visit: Payer: Self-pay | Admitting: Internal Medicine

## 2017-09-09 MED ORDER — MIRTAZAPINE 7.5 MG PO TABS: 7.5000 mg | ORAL_TABLET | Freq: Every day | ORAL | 0 refills | Status: DC

## 2017-09-09 NOTE — Telephone Encounter (Signed)
Patient is calling and states that she has 2 Mirtazapine left. She states she does not know when she will get the [facility] so she is needing to get a few at her local pharmacy. She states she did not know she had to call every time that she needed extra ones. She stated that she was told that she could get extra ones at her pharmacy anytime she needed. She would like Dr. Okey Duprerawford to contact her and explain to her what she is allowed to do.    [id number] [place] -- [id number] [facility] [address]  [address] [place] [id number]  Phone: [phone] Fax: [phone]

## 2017-09-12 ENCOUNTER — Encounter: Payer: Self-pay | Admitting: Internal Medicine

## 2017-09-12 ENCOUNTER — Ambulatory Visit (INDEPENDENT_AMBULATORY_CARE_PROVIDER_SITE_OTHER): Payer: Medicare Other | Admitting: Internal Medicine

## 2017-09-12 VITALS — BP 118/80 | HR 67 | Temp 97.6°F | Ht 65.0 in | Wt 129.0 lb

## 2017-09-12 DIAGNOSIS — R413 Other amnesia: Secondary | ICD-10-CM | POA: Diagnosis not present

## 2017-09-12 NOTE — Progress Notes (Signed)
   Subjective:    Patient ID: Rachel ParkinsJulia Ferguson, female    DOB: 06/02/1931, 84 y.o.   MRN: [id number]  HPI The patient is an 84 YO female coming in for concerns about alzheimer's disease listed on her chart. She was asked to do a driving exam for license and has not followed through on these requirements yet. She is concerned about she has googled alzheimer's and does not feel she has any of the symptoms. She does not have any memory problems to her knowledge (son not with her but she lives with him). She does not want to speak for him about how he feels about her memory.   Review of Systems  Constitutional: Negative.   HENT: Negative.   Eyes: Negative.   Respiratory: Negative for cough, chest tightness and shortness of breath.   Cardiovascular: Negative for chest pain, palpitations and leg swelling.  Gastrointestinal: Negative for abdominal distention, abdominal pain, constipation, diarrhea, nausea and vomiting.  Musculoskeletal: Negative.   Skin: Negative.   Neurological: Negative.   Psychiatric/Behavioral: Negative.       Objective:   Physical Exam  Constitutional: She is oriented to person, place, and time. She appears well-developed and well-nourished.  HENT:  Head: Normocephalic and atraumatic.  Eyes: EOM are normal.  Neck: Normal range of motion.  Cardiovascular: Normal rate and regular rhythm.  Pulmonary/Chest: Effort normal and breath sounds normal. No respiratory distress. She has no wheezes. She has no rales.  Abdominal: Soft. Bowel sounds are normal. She exhibits no distension. There is no tenderness. There is no rebound.  Musculoskeletal: She exhibits no edema.  Neurological: She is alert and oriented to person, place, and time. Coordination normal.  Hard to redirect away from her complaints about prior health care service  Skin: Skin is warm and dry.  Psychiatric: She has a normal mood and affect.   Vitals:   09/12/17 1059  BP: 118/80  Pulse: 67  Temp: 97.6 F (36.4 C)    TempSrc: Oral  SpO2: 98%  Weight: 129 lb (58.5 kg)  Height: 5\' 5"  (1.651 m)      Assessment & Plan:

## 2017-09-12 NOTE — Assessment & Plan Note (Addendum)
Referral for second opinion neurology referral. She was made aware that even if they think she does not have alzheimer's we cannot remove the prior evaluation from her record. She is not on any medications which should alter her memory currently. Recent thyroid levels normal so will not recheck today. She is very intelligent so any memory changes at this time would be subtle.

## 2017-09-12 NOTE — Patient Instructions (Signed)
We will get you in for the second opinion on the memory.

## 2017-09-27 ENCOUNTER — Ambulatory Visit: Payer: Medicare Other | Admitting: Neurology

## 2017-09-28 ENCOUNTER — Telehealth: Payer: Self-pay

## 2017-09-28 NOTE — Telephone Encounter (Signed)
Spoke with pt.  She states that she had an appointment scheduled for yesterday but did not receive a confirmation or reminder call.  Let pt know that yesterday's appointment was rescheduled for July back in January.  Pt states that she was at her son's office, [facility] stating that she is of sound mind.  No one disagreed with her then, so she states that that is proof that she does not have alzheimer's.  She asks that Dr. Karel JarvisAquino return her call with a complete list of all symptoms of Alzheimer's.  I explained that not everyone has the obvious sign/symptoms of this disease, but that cognitive testing is our main form diagnosing.  She expressed that she did "fantastic" on her testing and therefore she knows that she does not have this.  Pt asked that July appointment be canceled and that she will call and reschedule "if and when that time comes"

## 2017-10-11 ENCOUNTER — Encounter: Payer: Self-pay | Admitting: Internal Medicine

## 2017-10-11 ENCOUNTER — Ambulatory Visit (INDEPENDENT_AMBULATORY_CARE_PROVIDER_SITE_OTHER): Payer: Medicare Other | Admitting: Internal Medicine

## 2017-10-11 DIAGNOSIS — M25551 Pain in right hip: Secondary | ICD-10-CM | POA: Diagnosis not present

## 2017-10-11 NOTE — Progress Notes (Signed)
   Subjective:    Patient ID: Rachel Ferguson, female    DOB: 05-26-1931, 84 y.o.   MRN: [id number]  HPI The patient is an 84 YO female coming in for right hip pain. Started while she was on a sailing trip with family. She was not moving around as much due to limited space on the ship. She typically gets 10K steps per day at home. She was having pain and stiffness. This has alleviated some since being home and active again. She denies pain with walking now or night pain. Denies falls or overuse. No numbness or weakness.   Review of Systems  Constitutional: Negative.   Respiratory: Negative for cough, chest tightness and shortness of breath.   Cardiovascular: Negative for chest pain, palpitations and leg swelling.  Gastrointestinal: Negative for abdominal distention, abdominal pain, constipation, diarrhea, nausea and vomiting.  Musculoskeletal: Positive for arthralgias.  Skin: Negative.   Neurological: Negative.   Psychiatric/Behavioral: Negative.       Objective:   Physical Exam  Constitutional: She is oriented to person, place, and time. She appears well-developed and well-nourished.  HENT:  Head: Normocephalic and atraumatic.  Eyes: EOM are normal.  Neck: Normal range of motion.  Cardiovascular: Normal rate and regular rhythm.  Pulmonary/Chest: Effort normal and breath sounds normal. No respiratory distress. She has no wheezes. She has no rales.  Abdominal: Soft. She exhibits no distension. There is no tenderness. There is no rebound.  Musculoskeletal: She exhibits no edema or tenderness.  No pain on exam  Neurological: She is alert and oriented to person, place, and time. Coordination normal.  Skin: Skin is warm and dry.   Vitals:   10/11/17 1310  BP: 112/68  Pulse: (!) 58  Temp: 97.8 F (36.6 C)  TempSrc: Oral  SpO2: 98%  Weight: 126 lb (57.2 kg)  Height:  (1.651 m)      Assessment & Plan:

## 2017-10-11 NOTE — Assessment & Plan Note (Signed)
Advised stretching and exercise to help with joint mobility. Advised aspercreme or tiger balm otc for pain. No indication for imaging today. Call back if worsening.

## 2017-10-11 NOTE — Patient Instructions (Addendum)
Back Exercises If you have pain in your back, do these exercises 2-3 times each day or as told by your doctor. When the pain goes away, do the exercises once each day, but repeat the steps more times for each exercise (do more repetitions). If you do not have pain in your back, do these exercises once each day or as told by your doctor. Exercises Single Knee to Chest  Do these steps 3-5 times in a row for each leg: 1. Lie on your back on a firm bed or the floor with your legs stretched out. 2. Bring one knee to your chest. 3. Hold your knee to your chest by grabbing your knee or thigh. 4. Pull on your knee until you feel a gentle stretch in your lower back. 5. Keep doing the stretch for 10-30 seconds. 6. Slowly let go of your leg and straighten it.  Pelvic Tilt  Do these steps 5-10 times in a row: 1. Lie on your back on a firm bed or the floor with your legs stretched out. 2. Bend your knees so they point up to the ceiling. Your feet should be flat on the floor. 3. Tighten your lower belly (abdomen) muscles to press your lower back against the floor. This will make your tailbone point up to the ceiling instead of pointing down to your feet or the floor. 4. Stay in this position for 5-10 seconds while you gently tighten your muscles and breathe evenly.  Cat-Cow  Do these steps until your lower back bends more easily: 1. Get on your hands and knees on a firm surface. Keep your hands under your shoulders, and keep your knees under your hips. You may put padding under your knees. 2. Let your head hang down, and make your tailbone point down to the floor so your lower back is round like the back of a cat. 3. Stay in this position for 5 seconds. 4. Slowly lift your head and make your tailbone point up to the ceiling so your back hangs low (sags) like the back of a cow. 5. Stay in this position for 5 seconds.  Press-Ups  Do these steps 5-10 times in a row: 1. Lie on your belly (face-down)  on the floor. 2. Place your hands near your head, about shoulder-width apart. 3. While you keep your back relaxed and keep your hips on the floor, slowly straighten your arms to raise the top half of your body and lift your shoulders. Do not use your back muscles. To make yourself more comfortable, you may change where you place your hands. 4. Stay in this position for 5 seconds. 5. Slowly return to lying flat on the floor.  Bridges  Do these steps 10 times in a row: 1. Lie on your back on a firm surface. 2. Bend your knees so they point up to the ceiling. Your feet should be flat on the floor. 3. Tighten your butt muscles and lift your butt off of the floor until your waist is almost as high as your knees. If you do not feel the muscles working in your butt and the back of your thighs, slide your feet 1-2 inches farther away from your butt. 4. Stay in this position for 3-5 seconds. 5. Slowly lower your butt to the floor, and let your butt muscles relax.  If this exercise is too easy, try doing it with your arms crossed over your chest. Belly Crunches  Do these steps 5-10 times in   a row: 1. Lie on your back on a firm bed or the floor with your legs stretched out. 2. Bend your knees so they point up to the ceiling. Your feet should be flat on the floor. 3. Cross your arms over your chest. 4. Tip your chin a little bit toward your chest but do not bend your neck. 5. Tighten your belly muscles and slowly raise your chest just enough to lift your shoulder blades a tiny bit off of the floor. 6. Slowly lower your chest and your head to the floor.  Back Lifts Do these steps 5-10 times in a row: 1. Lie on your belly (face-down) with your arms at your sides, and rest your forehead on the floor. 2. Tighten the muscles in your legs and your butt. 3. Slowly lift your chest off of the floor while you keep your hips on the floor. Keep the back of your head in line with the curve in your back. Look at  the floor while you do this. 4. Stay in this position for 3-5 seconds. 5. Slowly lower your chest and your face to the floor.  Contact a doctor if:  Your back pain gets a lot worse when you do an exercise.  Your back pain does not lessen 2 hours after you exercise. If you have any of these problems, stop doing the exercises. Do not do them again unless your doctor says it is okay. Get help right away if:  You have sudden, very bad back pain. If this happens, stop doing the exercises. Do not do them again unless your doctor says it is okay. This information is not intended to replace advice given to you by your health care provider. Make sure you discuss any questions you have with your health care provider. Document Released: 07/03/2010 Document Revised: 11/06/2015 Document Reviewed: 07/25/2014 Elsevier Interactive Patient Education  2018 [employer].    Trochanteric Bursitis Rehab Ask your health care provider which exercises are safe for you. Do exercises exactly as told by your health care provider and adjust them as directed. It is normal to feel mild stretching, pulling, tightness, or discomfort as you do these exercises, but you should stop right away if you feel sudden pain or your pain gets worse.Do not begin these exercises until told by your health care provider. Stretching exercises These exercises warm up your muscles and joints and improve the movement and flexibility of your hip. These exercises also help to relieve pain and stiffness. Exercise A: Iliotibial band stretch  1. Lie on your side with your left / right leg in the top position. 2. Bend your left / right knee and grab your ankle. 3. Slowly bring your knee back so your thigh is behind your body. 4. Slowly lower your knee toward the floor until you feel a gentle stretch on the outside of your left / right thigh. If you do not feel a stretch and your knee will not fall farther, place the heel of your other foot on  top of your outer knee and pull your thigh down farther. 5. Hold this position for __________ seconds. 6. Slowly return to the starting position. Repeat __________ times. Complete this exercise __________ times a day. Strengthening exercises These exercises build strength and endurance in your hip and pelvis. Endurance is the ability to use your muscles for a long time, even after they get tired. Exercise B: Bridge ( hip extensors) 1. Lie on your back on a firm surface  with your knees bent and your feet flat on the floor. 2. Tighten your buttocks muscles and lift your buttocks off the floor until your trunk is level with your thighs. You should feel the muscles working in your buttocks and the back of your thighs. If this exercise is too easy, try doing it with your arms crossed over your chest. 3. Hold this position for __________ seconds. 4. Slowly return to the starting position. 5. Let your muscles relax completely between repetitions. Repeat __________ times. Complete this exercise __________ times a day. Exercise C: Squats ( knee extensors and  quadriceps) 1. Stand in front of a table, with your feet and knees pointing straight ahead. You may rest your hands on the table for balance but not for support. 2. Slowly bend your knees and lower your hips like you are going to sit in a chair. ? Keep your weight over your heels, not over your toes. ? Keep your lower legs upright so they are parallel with the table legs. ? Do not let your hips go lower than your knees. ? Do not bend lower than told by your health care provider. ? If your hip pain increases, do not bend as low. 3. Hold this position for __________ seconds. 4. Slowly push with your legs to return to standing. Do not use your hands to pull yourself to standing. Repeat __________ times. Complete this exercise __________ times a day. Exercise D: Hip hike 1. Stand sideways on a bottom step. Stand on your left / right leg with your  other foot unsupported next to the step. You can hold onto the railing or wall if needed for balance. 2. Keeping your knees straight and your torso square, lift your left / right hip up toward the ceiling. 3. Hold this position for __________ seconds. 4. Slowly let your left / right hip lower toward the floor, past the starting position. Your foot should get closer to the floor. Do not lean or bend your knees. Repeat __________ times. Complete this exercise __________ times a day. Exercise E: Single leg stand 1. Stand near a counter or door frame that you can hold onto for balance as needed. It is helpful to stand in front of a mirror for this exercise so you can watch your hip. 2. Squeeze your left / right buttock muscles then lift up your other foot. Do not let your left / right hip push out to the side. 3. Hold this position for __________ seconds. Repeat __________ times. Complete this exercise __________ times a day. This information is not intended to replace advice given to you by your health care provider. Make sure you discuss any questions you have with your health care provider. Document Released: 07/08/2004 Document Revised: 02/05/2016 Document Reviewed: 05/16/2015 Elsevier Interactive Patient Education  [employer].

## 2017-11-10 ENCOUNTER — Ambulatory Visit (INDEPENDENT_AMBULATORY_CARE_PROVIDER_SITE_OTHER): Payer: Medicare Other | Admitting: Family Medicine

## 2017-11-10 ENCOUNTER — Encounter: Payer: Self-pay | Admitting: Family Medicine

## 2017-11-10 DIAGNOSIS — J069 Acute upper respiratory infection, unspecified: Secondary | ICD-10-CM

## 2017-11-10 NOTE — Patient Instructions (Signed)
Please try things such as zyrtec-D or allegra-D which is an antihistamine and decongestant.   Please try afrin which will help with nasal congestion but use for only three days.   Please also try using a netti pot on a regular occasion.  Honey can help with a sore throat.   Lozenges and Chloraseptic spray can be helpful for sore throat.

## 2017-11-10 NOTE — Progress Notes (Signed)
Rachel Ferguson - 84 y.o. female MRN [id number]  Date of birth: 01/08/1931  SUBJECTIVE:  Including CC & ROS.  Chief Complaint  Patient presents with  . Cough    Rachel Ferguson is a 84 y.o. female that is presenting with sore throat and cough. Symptoms have been ongoing for four days. Admits to productive cough and difficulty swallowing. Denies fever or body aches.She has been around friends with similar symptoms. She has not been taking anything for her symptoms.     Review of Systems  Constitutional: Negative for fever.  HENT: Positive for congestion and sore throat.   Respiratory: Positive for cough.     HISTORY: Past Medical, Surgical, Social, and Family History Reviewed & Updated per EMR.   Pertinent Historical Findings include:  Past Medical History:  Diagnosis Date  . Colon polyps   . GERD (gastroesophageal reflux disease)   . Migraines   . Osteopenia   . Thyroid disease     Past Surgical History:  Procedure Laterality Date  . KNEE SURGERY Right   . TONSILLECTOMY AND ADENOIDECTOMY      Allergies  Allergen Reactions  . Ciprofloxacin Other (See Comments)    Had knee pain after taking Cipro. Had knee pain after taking Cipro. Other reaction(s): Other (See Comments) Had knee pain after taking Cipro.    Family History  Problem Relation Age of Onset  . Cancer Mother        breast  . Heart disease Father   . Alzheimer's disease Sister   . Heart disease Maternal Grandmother   . Tuberculosis Paternal Grandmother   . Cancer Sister   . Melanoma Sister      Social History   Socioeconomic History  . Marital status: Widowed    Spouse name: Not on file  . Number of children: 3  . Years of education: 84  . Highest education level: Not on file  Occupational History  . Occupation: retired Runner, broadcasting/film/video  . Occupation: Clinical research associate  Social Needs  . Financial resource strain: Not on file  . Food insecurity:    Worry: Not on file    Inability: Not on file  . Transportation needs:     Medical: Not on file    Non-medical: Not on file  Tobacco Use  . Smoking status: Former Smoker    Packs/day: 0.25    Years: 5.00    Pack years: 1.25    Last attempt to quit: 05/05/1956    Years since quitting: 61.5  . Smokeless tobacco: Never Used  Substance and Sexual Activity  . Alcohol use: Yes    Alcohol/week: 8.4 oz    Types: 14 Standard drinks or equivalent per week    Comment: 1-2 glasses red wine nightly  . Drug use: No  . Sexual activity: Not on file  Lifestyle  . Physical activity:    Days per week: Not on file    Minutes per session: Not on file  . Stress: Not on file  Relationships  . Social connections:    Talks on phone: Not on file    Gets together: Not on file    Attends religious service: Not on file    Active member of club or organization: Not on file    Attends meetings of clubs or organizations: Not on file    Relationship status: Not on file  . Intimate partner violence:    Fear of current or ex partner: Not on file    Emotionally abused: Not on file  Physically abused: Not on file    Forced sexual activity: Not on file  Other Topics Concern  . Not on file  Social History Narrative   Lives with one of her sons and grandson in a 2 story home but stays on the first floor.  Has 3 children.  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  Education: college.      PHYSICAL EXAM:  VS: BP 126/78 (BP Location: Left Arm, Patient Position: Sitting, Cuff Size: Normal)   Pulse 67   Temp 98.6 F (37 C) (Oral)   Ht  (1.651 m)   Wt 125 lb (56.7 kg)   SpO2 98%   BMI 20.80 kg/m  Physical Exam Gen: NAD, alert, cooperative with exam,  ENT: normal lips, normal nasal mucosa, tympanic membranes clear and intact bilaterally, normal oropharynx, no cervical lymphadenopathy Eye: normal EOM, normal conjunctiva and lids CV:  no edema, +2 pedal pulses, regular rate and rhythm, S1-S2   Resp: no accessory muscle use, non-labored, clear to  auscultation bilaterally, no crackles or wheezes Skin: no rashes, no areas of induration  Neuro: normal tone, normal sensation to touch Psych:  normal insight, alert and oriented MSK: Normal gait, normal strength       ASSESSMENT & PLAN:   URI (upper respiratory infection) Symptoms likely viral in nature.  - counseled on supportive care  - given indications to follow up

## 2017-11-12 DIAGNOSIS — J069 Acute upper respiratory infection, unspecified: Secondary | ICD-10-CM | POA: Insufficient documentation

## 2017-11-12 NOTE — Assessment & Plan Note (Signed)
Symptoms likely viral in nature.  - counseled on supportive care  - given indications to follow up.  

## 2017-12-02 ENCOUNTER — Ambulatory Visit (INDEPENDENT_AMBULATORY_CARE_PROVIDER_SITE_OTHER): Payer: Medicare Other | Admitting: *Deleted

## 2017-12-02 VITALS — BP 134/78 | HR 59 | Resp 16 | Ht 65.0 in | Wt 128.0 lb

## 2017-12-02 DIAGNOSIS — Z0000 Encounter for general adult medical examination without abnormal findings: Secondary | ICD-10-CM | POA: Diagnosis not present

## 2017-12-02 NOTE — Progress Notes (Signed)
Subjective:   Rachel Ferguson is a 84 y.o. female who presents for an Initial Medicare Annual Wellness Visit.  Review of Systems    No ROS.  Medicare Wellness Visit. Additional risk factors are reflected in the social history.   Cardiac Risk Factors include: advanced age (>63men, >63 women);hypertension Sleep patterns: gets up 0 times nightly to void and sleeps 7 hours nightly.    Home Safety/Smoke Alarms: Feels safe in home. Smoke alarms in place.  Living environment; residence and Firearm Safety: 1-story house/ trailer, no firearms, Lives with son, no needs for DME, good support system. Seat Belt Safety/Bike Helmet: Wears seat belt.     Objective:    Today's Vitals   12/02/17 1528  BP: 134/78  Pulse: (!) 59  Resp: 16  SpO2: 99%  Weight: 128 lb (58.1 kg)  Height: 5\' 5"  (1.651 m)   Body mass index is 21.3 kg/m.  Advanced Directives 12/02/2017 12/03/2016 11/13/2016  Does Patient Have a Medical Advance Directive? Yes No Yes  Type of Estate agent of [place];Living will - -  Would patient like information on creating a medical advance directive? - No - Patient declined -    Current Medications (verified) Outpatient Encounter Medications as of 12/02/2017  Medication Sig  . Apoaequorin (PREVAGEN PO) Take 1 tablet by mouth daily.  Marland Kitchen levothyroxine (SYNTHROID, LEVOTHROID) 75 MCG tablet TAKE 1 TABLET DAILY BEFORE BREAKFAST  . mirtazapine (REMERON) 7.5 MG tablet Take 1 tablet (7.5 mg total) by mouth at bedtime.  . Multiple Vitamin (MULTI-VITAMIN PO) Take 1 tablet by mouth.  . Multiple Vitamins-Minerals (PRESERVISION AREDS PO) Take 1 tablet by mouth daily.  Marland Kitchen omeprazole (PRILOSEC) 20 MG capsule TAKE 1 CAPSULE DAILY   No facility-administered encounter medications on file as of 12/02/2017.     Allergies (verified) Ciprofloxacin   History: Past Medical History:  Diagnosis Date  . Colon polyps   . GERD (gastroesophageal reflux disease)   . Migraines   .  Osteopenia   . Thyroid disease    Past Surgical History:  Procedure Laterality Date  . KNEE SURGERY Right   . TONSILLECTOMY AND ADENOIDECTOMY     Family History  Problem Relation Age of Onset  . Cancer Mother        breast  . Heart disease Father   . Alzheimer's disease Sister   . Heart disease Maternal Grandmother   . Tuberculosis Paternal Grandmother   . Cancer Sister   . Melanoma Sister    Social History   Socioeconomic History  . Marital status: Widowed    Spouse name: Not on file  . Number of children: 3  . Years of education: 47  . Highest education level: Not on file  Occupational History  . Occupation: retired Runner, broadcasting/film/video  . Occupation: Clinical research associate  Social Needs  . Financial resource strain: Not hard at all  . Food insecurity:    Worry: Never true    Inability: Never true  . Transportation needs:    Medical: No    Non-medical: No  Tobacco Use  . Smoking status: Former Smoker    Packs/day: 0.25    Years: 5.00    Pack years: 1.25    Last attempt to quit: 05/05/1956    Years since quitting: 61.6  . Smokeless tobacco: Never Used  Substance and Sexual Activity  . Alcohol use: Yes    Alcohol/week: 8.4 oz    Types: 14 Standard drinks or equivalent per week    Comment: 1-2  glasses red Disney Ruggiero nightly  . Drug use: No  . Sexual activity: Never  Lifestyle  . Physical activity:    Days per week: 5 days    Minutes per session: 50 min  . Stress: Only a little  Relationships  . Social connections:    Talks on phone: More than three times a week    Gets together: More than three times a week    Attends religious service: Not on file    Active member of club or organization: Not on file    Attends meetings of clubs or organizations: Not on file    Relationship status: Not on file  Other Topics Concern  . Not on file  Social History Narrative   Lives with one of her sons and grandson in a 2 story home but stays on the first floor.  Has 3 children.  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  Education: college.     Tobacco Counseling Counseling given: Not Answered  Activities of Daily Living In your present state of health, do you have any difficulty performing the following activities: 12/02/2017 12/04/2016  Hearing? N N  Vision? N N  Difficulty concentrating or making decisions? N N  Walking or climbing stairs? N N  Dressing or bathing? N N  Doing errands, shopping? N N  Preparing Food and eating ? N -  Using the Toilet? N -  In the past six months, have you accidently leaked urine? N -  Do you have problems with loss of bowel control? N -  Managing your Medications? N -  Managing your Finances? N -  Housekeeping or managing your Housekeeping? N -  Some recent data might be hidden     Immunizations and Health Maintenance Immunization History  Administered Date(s) Administered  . Influenza-Unspecified 03/16/2016, 02/21/2017  . Pneumococcal Conjugate-13 12/27/2013, 06/29/2015  . Pneumococcal Polysaccharide-23 02/26/2013  . Tdap 06/29/2015  . Zoster 12/22/2009, 06/15/2015   There are no preventive care reminders to display for this patient.  Patient Care Team: Myrlene Brokerrawford, Elizabeth A, MD as PCP - General (Internal Medicine)  Indicate any recent Medical Services you may have received from other than [facility] providers in the past year (date may be approximate).     Assessment:   This is a routine wellness examination for Rachel Ferguson. Physical assessment deferred to PCP.   Hearing/Vision screen  Visual Acuity Screening   Right eye Left eye Both eyes  Without correction:   20/32  With correction:     Hearing Screening Comments: Able to hear conversational tones w/o difficulty. No issues reported.  Passed whisper test  Dietary issues and exercise activities discussed: Current Exercise Habits: Home exercise routine, Type of exercise: walking;stretching(jogging), Time (Minutes): 45, Frequency (Times/Week): 5, Weekly Exercise  (Minutes/Week): 225, Intensity: Mild, Exercise limited by: None identified  Diet (meal preparation, eat out, water intake, caffeinated beverages, dairy products, fruits and vegetables): in general, a "healthy" diet  , well balanced, eats a variety of fruits and vegetables daily, limits salt, fat/cholesterol, sugar,carbohydrates,caffeine, drinks 6-8 glasses of water daily.   Goals    . Patient Stated     Become more socially active by looking into the [facility] and start going to work out class. Continue going to church, go to bridge club and book club.      Depression Screen PHQ 2/9 Scores 12/02/2017 05/05/2016 11/24/2015  PHQ - 2 Score 1 0 0  PHQ- 9 Score 1 - -  Fall Risk Fall Risk  12/02/2017 06/29/2017 03/28/2017 01/03/2017 05/05/2016  Falls in the past year? No No No No No   Cognitive Function: MMSE - Mini Mental State Exam 12/02/2017 03/29/2017  Not completed: - Refused  Orientation to time 5 -  Orientation to Place 5 -  Registration 3 -  Attention/ Calculation 3 -  Recall 2 -  Language- name 2 objects 2 -  Language- repeat 1 -  Language- follow 3 step command 3 -  Language- read & follow direction 1 -  Write a sentence 1 -  Copy design 1 -  Total score 27 -   Montreal Cognitive Assessment  01/03/2017  Visuospatial/ Executive (0/5) 3  Naming (0/3) 2  Attention: Read list of digits (0/2) 2  Attention: Read list of letters (0/1) 1  Attention: Serial 7 subtraction starting at 100 (0/3) 3  Language: Repeat phrase (0/2) 2  Language : Fluency (0/1) 1  Abstraction (0/2) 2  Delayed Recall (0/5) 5  Orientation (0/6) 6  Total 27      Screening Tests Health Maintenance  Topic Date Due  . INFLUENZA VACCINE  01/12/2018  . TETANUS/TDAP  06/28/2025  . PNA vac Low Risk Adult  Completed  . DEXA SCAN  Addressed     Plan:    Continue doing brain stimulating activities (puzzles, reading, adult coloring books, staying active) to keep memory sharp.   Continue to eat  heart healthy diet (full of fruits, vegetables, whole grains, lean protein, water--limit salt, fat, and sugar intake) and increase physical activity as tolerated.  I have personally reviewed and noted the following in the patient's chart:   . Medical and social history . Use of alcohol, tobacco or illicit drugs  . Current medications and supplements . Functional ability and status . Nutritional status . Physical activity . Advanced directives . List of other physicians . Vitals . Screenings to include cognitive, depression, and falls . Referrals and appointments  In addition, I have reviewed and discussed with patient certain preventive protocols, quality metrics, and best practice recommendations. A written personalized care plan for preventive services as well as general preventive health recommendations were provided to patient.     Wanda Plump, RN   12/02/2017

## 2017-12-02 NOTE — Progress Notes (Signed)
Medical screening examination/treatment/procedure(s) were performed by non-physician practitioner and as supervising physician I was immediately available for consultation/collaboration. I agree with above. Elizabeth A Crawford, MD 

## 2017-12-02 NOTE — Patient Instructions (Addendum)
Continue doing brain stimulating activities (puzzles, reading, adult coloring books, staying active) to keep memory sharp.   Continue to eat heart healthy diet (full of fruits, vegetables, whole grains, lean protein, water--limit salt, fat, and sugar intake) and increase physical activity as tolerated.   Rachel Ferguson , Thank you for taking time to come for your Medicare Wellness Visit. I appreciate your ongoing commitment to your health goals. Please review the following plan we discussed and let me know if I can assist you in the future.   These are the goals we discussed: Goals    . Patient Stated     Become more socially active by looking into the [facility] and start going to work out class. Continue going to church, go to bridge club and book club.       This is a list of the screening recommended for you and due dates:  Health Maintenance  Topic Date Due  . Flu Shot  01/12/2018  . Tetanus Vaccine  06/28/2025  . Pneumonia vaccines  Completed  . DEXA scan (bone density measurement)  Addressed   Health Maintenance, Female Adopting a healthy lifestyle and getting preventive care can go a long way to promote health and wellness. Talk with your health care provider about what schedule of regular examinations is right for you. This is a good chance for you to check in with your provider about disease prevention and staying healthy. In between checkups, there are plenty of things you can do on your own. Experts have done a lot of research about which lifestyle changes and preventive measures are most likely to keep you healthy. Ask your health care provider for more information. Weight and diet Eat a healthy diet  Be sure to include plenty of vegetables, fruits, low-fat dairy products, and lean protein.  Do not eat a lot of foods high in solid fats, added sugars, or salt.  Get regular exercise. This is one of the most important things you can do for your health. ? Most adults should  exercise for at least 150 minutes each week. The exercise should increase your heart rate and make you sweat (moderate-intensity exercise). ? Most adults should also do strengthening exercises at least twice a week. This is in addition to the moderate-intensity exercise.  Maintain a healthy weight  Body mass index (BMI) is a measurement that can be used to identify possible weight problems. It estimates body fat based on height and weight. Your health care provider can help determine your BMI and help you achieve or maintain a healthy weight.  For females 49 years of age and older: ? A BMI below 18.5 is considered underweight. ? A BMI of 18.5 to 24.9 is normal. ? A BMI of 25 to 29.9 is considered overweight. ? A BMI of 30 and above is considered obese.  Watch levels of cholesterol and blood lipids  You should start having your blood tested for lipids and cholesterol at 82 years of age, then have this test every 5 years.  You may need to have your cholesterol levels checked more often if: ? Your lipid or cholesterol levels are high. ? You are older than 82 years of age. ? You are at high risk for heart disease.  Cancer screening Lung Cancer  Lung cancer screening is recommended for adults 1-18 years old who are at high risk for lung cancer because of a history of smoking.  A yearly low-dose CT scan of the lungs is recommended  for people who: ? Currently smoke. ? Have quit within the past 15 years. ? Have at least a 30-pack-year history of smoking. A pack year is smoking an average of one pack of cigarettes a day for 1 year.  Yearly screening should continue until it has been 15 years since you quit.  Yearly screening should stop if you develop a health problem that would prevent you from having lung cancer treatment.  Breast Cancer  Practice breast self-awareness. This means understanding how your breasts normally appear and feel.  It also means doing regular breast  self-exams. Let your health care provider know about any changes, no matter how small.  If you are in your 20s or 30s, you should have a clinical breast exam (CBE) by a health care provider every 1-3 years as part of a regular health exam.  If you are 59 or older, have a CBE every year. Also consider having a breast X-ray (mammogram) every year.  If you have a family history of breast cancer, talk to your health care provider about genetic screening.  If you are at high risk for breast cancer, talk to your health care provider about having an MRI and a mammogram every year.  Breast cancer gene (BRCA) assessment is recommended for women who have family members with BRCA-related cancers. BRCA-related cancers include: ? Breast. ? Ovarian. ? Tubal. ? Peritoneal cancers.  Results of the assessment will determine the need for genetic counseling and BRCA1 and BRCA2 testing.  Cervical Cancer Your health care provider may recommend that you be screened regularly for cancer of the pelvic organs (ovaries, uterus, and vagina). This screening involves a pelvic examination, including checking for microscopic changes to the surface of your cervix (Pap test). You may be encouraged to have this screening done every 3 years, beginning at age 18.  For women ages 26-65, health care providers may recommend pelvic exams and Pap testing every 3 years, or they may recommend the Pap and pelvic exam, combined with testing for human papilloma virus (HPV), every 5 years. Some types of HPV increase your risk of cervical cancer. Testing for HPV may also be done on women of any age with unclear Pap test results.  Other health care providers may not recommend any screening for nonpregnant women who are considered low risk for pelvic cancer and who do not have symptoms. Ask your health care provider if a screening pelvic exam is right for you.  If you have had past treatment for cervical cancer or a condition that could  lead to cancer, you need Pap tests and screening for cancer for at least 20 years after your treatment. If Pap tests have been discontinued, your risk factors (such as having a new sexual partner) need to be reassessed to determine if screening should resume. Some women have medical problems that increase the chance of getting cervical cancer. In these cases, your health care provider may recommend more frequent screening and Pap tests.  Colorectal Cancer  This type of cancer can be detected and often prevented.  Routine colorectal cancer screening usually begins at 83 years of age and continues through 82 years of age.  Your health care provider may recommend screening at an earlier age if you have risk factors for colon cancer.  Your health care provider may also recommend using home test kits to check for hidden blood in the stool.  A small camera at the end of a tube can be used to examine your colon  directly (sigmoidoscopy or colonoscopy). This is done to check for the earliest forms of colorectal cancer.  Routine screening usually begins at age 36.  Direct examination of the colon should be repeated every 5-10 years through 82 years of age. However, you may need to be screened more often if early forms of precancerous polyps or small growths are found.  Skin Cancer  Check your skin from head to toe regularly.  Tell your health care provider about any new moles or changes in moles, especially if there is a change in a mole's shape or color.  Also tell your health care provider if you have a mole that is larger than the size of a pencil eraser.  Always use sunscreen. Apply sunscreen liberally and repeatedly throughout the day.  Protect yourself by wearing long sleeves, pants, a wide-brimmed hat, and sunglasses whenever you are outside.  Heart disease, diabetes, and high blood pressure  High blood pressure causes heart disease and increases the risk of stroke. High blood pressure  is more likely to develop in: ? People who have blood pressure in the high end of the normal range (130-139/85-89 mm Hg). ? People who are overweight or obese. ? People who are African American.  If you are 80-31 years of age, have your blood pressure checked every 3-5 years. If you are 9 years of age or older, have your blood pressure checked every year. You should have your blood pressure measured twice-once when you are at a hospital or clinic, and once when you are not at a hospital or clinic. Record the average of the two measurements. To check your blood pressure when you are not at a hospital or clinic, you can use: ? An automated blood pressure machine at a pharmacy. ? A home blood pressure monitor.  If you are between 48 years and 53 years old, ask your health care provider if you should take aspirin to prevent strokes.  Have regular diabetes screenings. This involves taking a blood sample to check your fasting blood sugar level. ? If you are at a normal weight and have a low risk for diabetes, have this test once every three years after 82 years of age. ? If you are overweight and have a high risk for diabetes, consider being tested at a younger age or more often. Preventing infection Hepatitis B  If you have a higher risk for hepatitis B, you should be screened for this virus. You are considered at high risk for hepatitis B if: ? You were born in a country where hepatitis B is common. Ask your health care provider which countries are considered high risk. ? Your parents were born in a high-risk country, and you have not been immunized against hepatitis B (hepatitis B vaccine). ? You have HIV or AIDS. ? You use needles to inject street drugs. ? You live with someone who has hepatitis B. ? You have had sex with someone who has hepatitis B. ? You get hemodialysis treatment. ? You take certain medicines for conditions, including cancer, organ transplantation, and autoimmune  conditions.  Hepatitis C  Blood testing is recommended for: ? Everyone born from [birth year] through 1963. ? Anyone with known risk factors for hepatitis C.  Sexually transmitted infections (STIs)  You should be screened for sexually transmitted infections (STIs) including gonorrhea and chlamydia if: ? You are sexually active and are younger than 82 years of age. ? You are older than 82 years of age and your health care  provider tells you that you are at risk for this type of infection. ? Your sexual activity has changed since you were last screened and you are at an increased risk for chlamydia or gonorrhea. Ask your health care provider if you are at risk.  If you do not have HIV, but are at risk, it may be recommended that you take a prescription medicine daily to prevent HIV infection. This is called pre-exposure prophylaxis (PrEP). You are considered at risk if: ? You are sexually active and do not regularly use condoms or know the HIV status of your partner(s). ? You take drugs by injection. ? You are sexually active with a partner who has HIV.  Talk with your health care provider about whether you are at high risk of being infected with HIV. If you choose to begin PrEP, you should first be tested for HIV. You should then be tested every 3 months for as long as you are taking PrEP. Pregnancy  If you are premenopausal and you may become pregnant, ask your health care provider about preconception counseling.  If you may become pregnant, take 400 to 800 micrograms (mcg) of folic acid every day.  If you want to prevent pregnancy, talk to your health care provider about birth control (contraception). Osteoporosis and menopause  Osteoporosis is a disease in which the bones lose minerals and strength with aging. This can result in serious bone fractures. Your risk for osteoporosis can be identified using a bone density scan.  If you are 15 years of age or older, or if you are at risk for  osteoporosis and fractures, ask your health care provider if you should be screened.  Ask your health care provider whether you should take a calcium or vitamin D supplement to lower your risk for osteoporosis.  Menopause may have certain physical symptoms and risks.  Hormone replacement therapy may reduce some of these symptoms and risks. Talk to your health care provider about whether hormone replacement therapy is right for you. Follow these instructions at home:  Schedule regular health, dental, and eye exams.  Stay current with your immunizations.  Do not use any tobacco products including cigarettes, chewing tobacco, or electronic cigarettes.  If you are pregnant, do not drink alcohol.  If you are breastfeeding, limit how much and how often you drink alcohol.  Limit alcohol intake to no more than 1 drink per day for nonpregnant women. One drink equals 12 ounces of beer, 5 ounces of wine, or 1 ounces of hard liquor.  Do not use street drugs.  Do not share needles.  Ask your health care provider for help if you need support or information about quitting drugs.  Tell your health care provider if you often feel depressed.  Tell your health care provider if you have ever been abused or do not feel safe at home. This information is not intended to replace advice given to you by your health care provider. Make sure you discuss any questions you have with your health care provider. Document Released: 12/14/2010 Document Revised: 11/06/2015 Document Reviewed: 03/04/2015 Elsevier Interactive Patient Education  [employer].

## 2017-12-27 ENCOUNTER — Ambulatory Visit: Payer: Medicare Other | Admitting: Neurology

## 2018-01-09 ENCOUNTER — Other Ambulatory Visit (INDEPENDENT_AMBULATORY_CARE_PROVIDER_SITE_OTHER): Payer: Medicare Other

## 2018-01-09 ENCOUNTER — Telehealth: Payer: Self-pay | Admitting: Internal Medicine

## 2018-01-09 DIAGNOSIS — R300 Dysuria: Secondary | ICD-10-CM

## 2018-01-09 LAB — URINALYSIS, ROUTINE W REFLEX MICROSCOPIC: RBC / HPF: NONE SEEN (ref 0–?)

## 2018-01-09 NOTE — Telephone Encounter (Signed)
Patient informed of MD response. Patient stated that she has no idea how the Cipro has gotten on her allergies list, but she does not have an intolerance to it and she has used it in the past frequently.

## 2018-01-09 NOTE — Telephone Encounter (Signed)
Orders placed for labs in the basement to get checked and if needed rx will be sent in. Please let patient know that she has ciprofloxacin on her medication intolerance list and an alternative agent may be sent in.

## 2018-01-09 NOTE — Telephone Encounter (Signed)
Copied from CRM [id number]. Topic: Quick Communication - See Telephone Encounter >> Jan 09, 2018  8:03 AM Gean BirchwoodWilliams-Neal, Sade R wrote: Pt called in stated that she has an bladder infection and she would like a prescription of cipro sent to [id number] [place], [place] - [address] AT [facility] [address] [phone] (Phone) [phone] (Fax)  Please call pr back with an update   CB# [phone]

## 2018-01-10 ENCOUNTER — Telehealth: Payer: Self-pay | Admitting: Internal Medicine

## 2018-01-10 LAB — URINE CULTURE
MICRO NUMBER: 90893216
Result:: NO GROWTH
SPECIMEN QUALITY:: ADEQUATE

## 2018-01-10 NOTE — Telephone Encounter (Signed)
Copied from CRM [id number]. Topic: Quick Communication - Lab Results >> Jan 10, 2018 10:58 AM Oneal GroutSebastian, Jennifer S wrote: Requesting UA results

## 2018-01-10 NOTE — Telephone Encounter (Signed)
We are still waiting on results to come in

## 2018-01-20 ENCOUNTER — Emergency Department (HOSPITAL_COMMUNITY)
Admission: EM | Admit: 2018-01-20 | Discharge: 2018-01-20 | Disposition: A | Discharge status: Home / Self Care | Payer: Medicare Other | Attending: Emergency Medicine | Admitting: Emergency Medicine

## 2018-01-20 ENCOUNTER — Ambulatory Visit: Payer: Self-pay | Admitting: Internal Medicine

## 2018-01-20 ENCOUNTER — Ambulatory Visit: Payer: Medicare Other | Admitting: Internal Medicine

## 2018-01-20 ENCOUNTER — Telehealth: Payer: Self-pay | Admitting: Internal Medicine

## 2018-01-20 ENCOUNTER — Encounter (HOSPITAL_COMMUNITY): Payer: Self-pay

## 2018-01-20 DIAGNOSIS — R002 Palpitations: Secondary | ICD-10-CM | POA: Diagnosis not present

## 2018-01-20 DIAGNOSIS — E039 Hypothyroidism, unspecified: Secondary | ICD-10-CM | POA: Diagnosis not present

## 2018-01-20 DIAGNOSIS — I10 Essential (primary) hypertension: Secondary | ICD-10-CM | POA: Diagnosis not present

## 2018-01-20 DIAGNOSIS — Z79899 Other long term (current) drug therapy: Secondary | ICD-10-CM | POA: Diagnosis not present

## 2018-01-20 DIAGNOSIS — G301 Alzheimer's disease with late onset: Secondary | ICD-10-CM | POA: Insufficient documentation

## 2018-01-20 DIAGNOSIS — R42 Dizziness and giddiness: Secondary | ICD-10-CM | POA: Diagnosis present

## 2018-01-20 DIAGNOSIS — Z87891 Personal history of nicotine dependence: Secondary | ICD-10-CM | POA: Insufficient documentation

## 2018-01-20 LAB — CBC WITH DIFFERENTIAL/PLATELET
ABS IMMATURE GRANULOCYTES: 0 10*3/uL (ref 0.0–0.1)
BASOS ABS: 0 10*3/uL (ref 0.0–0.1)
BASOS PCT: 0 %
Eosinophils Absolute: 0 10*3/uL (ref 0.0–0.7)
Eosinophils Relative: 0 %
HCT: 37.1 % (ref 36.0–46.0)
HEMOGLOBIN: 12 g/dL (ref 12.0–15.0)
Immature Granulocytes: 0 %
LYMPHS PCT: 25 %
Lymphs Abs: 1.9 10*3/uL (ref 0.7–4.0)
MCH: 31.7 pg (ref 26.0–34.0)
MCHC: 32.3 g/dL (ref 30.0–36.0)
MCV: 98.1 fL (ref 78.0–100.0)
MONO ABS: 0.5 10*3/uL (ref 0.1–1.0)
Monocytes Relative: 7 %
NEUTROS ABS: 5 10*3/uL (ref 1.7–7.7)
Neutrophils Relative %: 68 %
PLATELETS: 190 10*3/uL (ref 150–400)
RBC: 3.78 MIL/uL — AB (ref 3.87–5.11)
RDW: 11.7 % (ref 11.5–15.5)
WBC: 7.4 10*3/uL (ref 4.0–10.5)

## 2018-01-20 LAB — BASIC METABOLIC PANEL
Anion gap: 9 (ref 5–15)
BUN: 17 mg/dL (ref 8–23)
CO2: 26 mmol/L (ref 22–32)
CREATININE: 0.9 mg/dL (ref 0.44–1.00)
Calcium: 9 mg/dL (ref 8.9–10.3)
Chloride: 105 mmol/L (ref 98–111)
GFR calc Af Amer: >60 mL/min (ref >60)
GFR, EST NON AFRICAN AMERICAN: 57 mL/min — AB (ref >60)
Glucose, Bld: 88 mg/dL (ref 70–99)
POTASSIUM: 3.7 mmol/L (ref 3.5–5.1)
SODIUM: 140 mmol/L (ref 135–145)

## 2018-01-20 LAB — I-STAT TROPONIN, ED: TROPONIN I, POC: 0 ng/mL (ref 0.00–0.08)

## 2018-01-20 LAB — MAGNESIUM: Magnesium: 2.1 mg/dL (ref 1.7–2.4)

## 2018-01-20 MED ORDER — SODIUM CHLORIDE 0.9 % IV BOLUS
1000.0000 mL | Freq: Once | INTRAVENOUS | Status: AC
  Administered 2018-01-20: 1000 mL via INTRAVENOUS

## 2018-01-20 NOTE — Telephone Encounter (Signed)
Called in wondering if her "Rachel Ferguson machine" was causing her to be dizzy and have a "racing heart".    (I had a very difficult time triaging this pt as she jumped all over the place with her symptoms and contradicted herself at times).  The Rachel Ferguson machine is used to lose weight per pt.   "It vibrates".      "I just want you to tell me what is wrong that I'm having these symptoms".   I let her know there was no way over the phone to diagnose dizziness or fast heart rates.  I triaged her for dizziness as this was her main concern and the symptoms she is still having denied having a racing heart as I was triaging her.  She woke up at 1am this morning and was dizzy and her heart felt like it was racing.   "I have to hold onto things to go to the bathroom just to make sure I don't fall".    "My heart is not racing now but I'm still dizzy since I woke up at 1:00".    "I have not been asleep since 1:00am.    "I just need to go back to sleep and I will be fine".      She refused to go to the ED when I let her know she really needed to go.   She was adamant she was not going to the ED.   "I just need some sleep".    She even refused to come in for an appt until I insisted she really needed to be seen.   "You can't tell me what's wrong with me"?   Again I let her know we can't diagnosed her over the phone.   She refused to come into the office.   "I just need to sleep then this dizziness will go away".   I finally got her to agree to an appt with Dr. Okey Dupre today at 3:00.   "I might make it by then".    "I just need to sleep".     I wasn't able to get her to understand the importance of being evaluated.    She didn't seem to understand that her symptoms are concerning even after explanation.   I questioned maybe some mild dementia?   I'm not sure she will come in for this visit.  I spoke with Sam flow coordinator at Dr. Frutoso Chase office.   I made her aware of the situation and why I made an appt with Dr. Okey Dupre  being pt should go to the ED.  Triage notes routed to Dr. Okey Dupre.     Reason for Disposition . [1] MODERATE dizziness (e.g., interferes with normal activities) AND [2] has NOT been evaluated by physician for this  (Exception: dizziness caused by heat exposure, sudden standing, or poor fluid intake)  Answer Assessment - Initial Assessment Questions 1. DESCRIPTION: "Describe your dizziness."     I'm dizzy now.    I have to hold onto things to walk to the bathroom.   If I stay in bed I think I will be ok.     I've had the dizziness tonight.   It started last night. 2. LIGHTHEADED: "Do you feel lightheaded?" (e.g., somewhat faint, woozy, weak upon standing)     It's an overall feeling.  I'm dizzy laying down and walking.   3. VERTIGO: "Do you feel like either you or the room is spinning or tilting?" (i.e. vertigo)  No 4. SEVERITY: "How bad is it?"  "Do you feel like you are going to faint?" "Can you stand and walk?"   - MILD - walking normally   - MODERATE - interferes with normal activities (e.g., work, school)    - SEVERE - unable to stand, requires support to walk, feels like passing out now.      I hold onto things to make sure I don't fall.   My head is just not right.   I just feel dizzy. 5. ONSET:  "When did the dizziness begin?"     I woke up at 1am this morning and that's when I first felt the dizziness.   I think I will be better if I just go to sleep.    I don't want to get it checked out today.   I think I just need to sleep.     I'm supposed to drive to another town to take someone to the doctor but I don't have to do that. 6. AGGRAVATING FACTORS: "Does anything make it worse?" (e.g., standing, change in head position)     My father died of CHF.   Do I have that?    7. HEART RATE: "Can you tell me your heart rate?" "How many beats in 15 seconds?"  (Note: not all patients can do this)       My heart was racing earlier.    Right now I only have this dizziness. 8. CAUSE: "What  do you think is causing the dizziness?"     I really don't know.  9. RECURRENT SYMPTOM: "Have you had dizziness before?" If so, ask: "When was the last time?" "What happened that time?"     Yes.    I think it was from a medication that I'm not taking anymore. 10. OTHER SYMPTOMS: "Do you have any other symptoms?" (e.g., fever, chest pain, vomiting, diarrhea, bleeding)       None of the above symptoms.    Just the dizziness and racing heart. 11. PREGNANCY: "Is there any chance you are pregnant?" "When was your last menstrual period?"       Not asked due to age.  Protocols used: DIZZINESS [facility]- LIGHTHEADEDNESS-A-AH

## 2018-01-20 NOTE — ED Notes (Signed)
Patient verbalizes understanding of discharge instructions. Opportunity for questioning and answers were provided. Ambulatory at discharge in NAD.  

## 2018-01-20 NOTE — ED Triage Notes (Signed)
Pt arrived via [facility] from PCP office c/o dizziness and palpitations yesterday.  Denies chest pain, SOB.

## 2018-01-20 NOTE — Telephone Encounter (Signed)
Pt arrived for appt 30 minutes late. [facility]. Pt feels too dizzy to drive and seems confused. At recommendation of provider and RN, EMS called for non emergent transport.

## 2018-01-20 NOTE — ED Provider Notes (Signed)
[facility] EMERGENCY DEPARTMENT Provider Note   CSN: [id number] Arrival date & time: 01/20/18  1646     History   Chief Complaint Chief Complaint  Patient presents with  . Dizziness  . Palpitations    HPI Rachel Ferguson is a 84 y.o. female.  84 yo F with a chief complaint of not feeling right in her head.  This started yesterday she felt that she had a bout where her heart was racing.  Estimates lasted for a couple minutes.  She had some dizziness with this.  That feeling is hard to describe and later so that maybe this is not dizziness she just does not feel right in her head.  She feels like something is not corrected there.  She denies any chest pain or shortness of breath denies neck pain or headaches.  Denies difficulty with her vision.  She denies that the room is spinning denies any imbalance.  She does not know anything that makes this better or worse.  She denies fevers vomiting or diarrhea.  Denies decreased oral intake.  Denies any recent medication changes.  She has been using a new device that she stands on and shakes and she thinks that may be that caused some of the symptoms.  The history is provided by the patient.  Dizziness  Associated symptoms: palpitations   Associated symptoms: no chest pain, no headaches, no nausea, no shortness of breath and no vomiting   Palpitations   Associated symptoms include dizziness. Pertinent negatives include no fever, no chest pain, no abdominal pain, no nausea, no vomiting, no headaches and no shortness of breath.  Illness  This is a recurrent problem. The current episode started yesterday. The problem occurs rarely. The problem has been resolved. Pertinent negatives include no chest pain, no abdominal pain, no headaches and no shortness of breath. Nothing aggravates the symptoms. Nothing relieves the symptoms. She has tried nothing for the symptoms. The treatment provided no relief.    Past Medical History:    Diagnosis Date  . Colon polyps   . GERD (gastroesophageal reflux disease)   . Migraines   . Osteopenia   . Thyroid disease     Patient Active Problem List   Diagnosis Date Noted  . URI (upper respiratory infection) 11/12/2017  . Right hip pain 10/11/2017  . Memory change 09/12/2017  . Late onset Alzheimer's disease without behavioral disturbance 03/29/2017  . HTN (hypertension) 11/20/2016  . Nodule of apex of right lung 05/27/2016  . Hypothyroidism 11/24/2015  . GERD (gastroesophageal reflux disease) 11/24/2015  . Insomnia 11/24/2015    Past Surgical History:  Procedure Laterality Date  . KNEE SURGERY Right   . TONSILLECTOMY AND ADENOIDECTOMY       OB History   None      Home Medications    Prior to Admission medications   Medication Sig Start Date End Date Taking? Authorizing Provider  Apoaequorin (PREVAGEN PO) Take 1 tablet by mouth daily.    [provider]  levothyroxine (SYNTHROID, LEVOTHROID) 75 MCG tablet TAKE 1 TABLET DAILY BEFORE BREAKFAST 05/26/17   Myrlene Broker, MD  mirtazapine (REMERON) 7.5 MG tablet Take 1 tablet (7.5 mg total) by mouth at bedtime. 09/09/17   Myrlene Broker, MD  Multiple Vitamin (MULTI-VITAMIN PO) Take 1 tablet by mouth.    [provider]  Multiple Vitamins-Minerals (PRESERVISION AREDS PO) Take 1 tablet by mouth daily.    [provider]  omeprazole (PRILOSEC) 20 MG capsule  TAKE 1 CAPSULE DAILY 08/22/17   Myrlene Broker, MD    Family History Family History  Problem Relation Age of Onset  . Cancer Mother        breast  . Heart disease Father   . Alzheimer's disease Sister   . Heart disease Maternal Grandmother   . Tuberculosis Paternal Grandmother   . Cancer Sister   . Melanoma Sister     Social History Social History   Tobacco Use  . Smoking status: Former Smoker    Packs/day: 0.25    Years: 5.00    Pack years: 1.25    Last attempt to quit: 05/05/1956    Years since  quitting: 61.7  . Smokeless tobacco: Never Used  Substance Use Topics  . Alcohol use: Yes    Alcohol/week: 14.0 standard drinks    Types: 14 Standard drinks or equivalent per week    Comment: 1-2 glasses red wine nightly  . Drug use: No     Allergies   Ciprofloxacin   Review of Systems Review of Systems  Constitutional: Negative for chills and fever.  HENT: Negative for congestion and rhinorrhea.   Eyes: Negative for redness and visual disturbance.  Respiratory: Negative for shortness of breath and wheezing.   Cardiovascular: Positive for palpitations. Negative for chest pain.  Gastrointestinal: Negative for abdominal pain, nausea and vomiting.  Genitourinary: Negative for dysuria and urgency.  Musculoskeletal: Negative for arthralgias and myalgias.  Skin: Negative for pallor and wound.  Neurological: Positive for dizziness and light-headedness. Negative for headaches.     Physical Exam Updated Vital Signs BP (!) 170/58   Pulse 69   Temp 98.5 F (36.9 C) (Oral)   Resp 19   Ht 5\' 5"  (1.651 m)   Wt 59 kg   SpO2 96%   BMI 21.63 kg/m   Physical Exam  Constitutional: She is oriented to person, place, and time. She appears well-developed and well-nourished. No distress.  HENT:  Head: Normocephalic and atraumatic.  Eyes: Pupils are equal, round, and reactive to light. EOM are normal.  Neck: Normal range of motion. Neck supple.  Cardiovascular: Normal rate and regular rhythm. Exam reveals no gallop and no friction rub.  No murmur heard. Pulmonary/Chest: Effort normal. She has no wheezes. She has no rales.  Abdominal: Soft. She exhibits no distension. There is no tenderness.  Musculoskeletal: She exhibits no edema or tenderness.  Neurological: She is alert and oriented to person, place, and time. She has normal strength. No cranial nerve deficit or sensory deficit. She displays a negative Romberg sign. Coordination and gait normal. GCS eye subscore is 4. GCS verbal  subscore is 5. GCS motor subscore is 6.  Benign neurologic exam.  Patient is able to ambulate without difficulty.  Skin: Skin is warm and dry. She is not diaphoretic.  Psychiatric: She has a normal mood and affect. Her behavior is normal.  Nursing note and vitals reviewed.    ED Treatments / Results  Labs (all labs ordered are listed, but only abnormal results are displayed) Labs Reviewed  CBC WITH DIFFERENTIAL/PLATELET - Abnormal; Notable for the following components:      Result Value   RBC 3.78 (*)    All other components within normal limits  BASIC METABOLIC PANEL - Abnormal; Notable for the following components:   GFR calc non Af Amer 57 (*)    All other components within normal limits  MAGNESIUM  I-STAT TROPONIN, ED    EKG EKG Interpretation  Date/Time:  Friday January 20 2018 16:55:33 EDT Ventricular Rate:  62 PR Interval:    QRS Duration: 88 QT Interval:  434 QTC Calculation: 441 R Axis:   44 Text Interpretation:  Sinus rhythm Atrial premature complex No significant change since last tracing Confirmed by Melene PlanFloyd, Marnisha Stampley [phone]([id number]) on 01/20/2018 4:59:18 PM   Radiology No results found.  Procedures Procedures (including critical care time)  Medications Ordered in ED Medications  sodium chloride 0.9 % bolus 1,000 mL (0 mLs Intravenous Stopped 01/20/18 2023)     Initial Impression / Assessment and Plan / ED Course  I have reviewed the triage vital signs and the nursing notes.  Pertinent labs & imaging results that were available during my care of the patient were reviewed by me and considered in my medical decision making (see chart for details).     84 yo F with a chief complaint of palpitations.  This occurred yesterday morning and she is continued to feel like something just is not right.  She is unable to describe that any further.  She has a benign neurologic exam for me.  Her heart rate is regular in sinus rhythm.  She had an admission last June for the same.  I  will give a bolus of fluids will check labs and reassess.  Hemoglobin stable.  Troponin is negative.  Magnesium is normal.  Patient without a recurrent bout of palpitations.  Telemetry monitor was examined and there is no concerning arrhythmia.  At this point we will discharge the patient home with PCP follow-up.    8:43 PM:  I have discussed the diagnosis/risks/treatment options with the patient and believe the pt to be eligible for discharge home to follow-up with PCP. We also discussed returning to the ED immediately if new or worsening sx occur. We discussed the sx which are most concerning (e.g., sudden worsening dizziness, inability to walk that necessitate immediate return. Medications administered to the patient during their visit and any new prescriptions provided to the patient are listed below.  Medications given during this visit Medications  sodium chloride 0.9 % bolus 1,000 mL (0 mLs Intravenous Stopped 01/20/18 2023)      The patient appears reasonably screen and/or stabilized for discharge and I doubt any other medical condition or other [facility] requiring further screening, evaluation, or treatment in the ED at this time prior to discharge.     Final Clinical Impressions(s) / ED Diagnoses   Final diagnoses:  Palpitations    ED Discharge Orders    None       Melene PlanFloyd, Benton Tooker, DO 01/20/18 2043

## 2018-01-20 NOTE — Telephone Encounter (Signed)
Called pt daughter, Lurena JoinerRebecca. Advised of patients symptoms and EMS call.

## 2018-01-20 NOTE — ED Notes (Signed)
Pt given sandwich and drink per RN, Crystal.

## 2018-01-20 NOTE — ED Notes (Signed)
Pt ambulated without difficulty, gait steady and even

## 2018-01-20 NOTE — Telephone Encounter (Signed)
noted 

## 2018-02-02 ENCOUNTER — Other Ambulatory Visit: Payer: Self-pay | Admitting: Internal Medicine

## 2018-02-15 ENCOUNTER — Other Ambulatory Visit: Payer: Self-pay | Admitting: Internal Medicine

## 2018-02-16 ENCOUNTER — Ambulatory Visit: Payer: Medicare Other | Admitting: Internal Medicine

## 2018-02-16 DIAGNOSIS — Z0289 Encounter for other administrative examinations: Secondary | ICD-10-CM

## 2018-02-18 ENCOUNTER — Other Ambulatory Visit: Payer: Self-pay | Admitting: Internal Medicine

## 2018-02-20 ENCOUNTER — Ambulatory Visit: Payer: Medicare Other | Admitting: Internal Medicine

## 2018-02-20 DIAGNOSIS — Z0289 Encounter for other administrative examinations: Secondary | ICD-10-CM

## 2018-02-21 ENCOUNTER — Encounter: Payer: Self-pay | Admitting: Internal Medicine

## 2018-02-21 ENCOUNTER — Ambulatory Visit (INDEPENDENT_AMBULATORY_CARE_PROVIDER_SITE_OTHER): Payer: Medicare Other | Admitting: Internal Medicine

## 2018-02-21 DIAGNOSIS — H6121 Impacted cerumen, right ear: Secondary | ICD-10-CM

## 2018-02-21 NOTE — Progress Notes (Signed)
   Subjective:    Patient ID: Rachel Ferguson, female    DOB: January 09, 1931, 84 y.o.   MRN: [id number]  HPI The patient is an 84 YO female coming in for hearing loss due to cerumen impaction. She does use hearing aids and is not hearing as well out of the right side. Left side is fairly normal. She denies pain in the ears. Denies sinus pressure or drainage. Denies fevers or chills. Started about 2-3 weeks ago and overall worsening.   Review of Systems  Constitutional: Negative.   HENT: Positive for ear discharge and hearing loss. Negative for congestion, postnasal drip, rhinorrhea, sinus pressure, sinus pain, sneezing, sore throat and trouble swallowing.   Eyes: Negative.   Respiratory: Negative for cough, chest tightness and shortness of breath.   Cardiovascular: Negative for chest pain, palpitations and leg swelling.  Gastrointestinal: Negative for abdominal distention, abdominal pain, constipation, diarrhea, nausea and vomiting.  Skin: Negative.   Neurological: Negative.   Psychiatric/Behavioral: Negative.       Objective:   Physical Exam  Constitutional: She is oriented to person, place, and time. She appears well-developed and well-nourished.  HENT:  Head: Normocephalic and atraumatic.  Right ear disimpacted with instrument with normal TM after disimpaction. Left ear normal with normal TM. Sinuses clear.   Eyes: EOM are normal.  Neck: Normal range of motion.  Cardiovascular: Normal rate and regular rhythm.  Pulmonary/Chest: Effort normal and breath sounds normal. No respiratory distress. She has no wheezes. She has no rales.  Abdominal: Soft. Bowel sounds are normal. She exhibits no distension. There is no tenderness. There is no rebound.  Musculoskeletal: She exhibits no edema.  Neurological: She is alert and oriented to person, place, and time. Coordination normal.  Skin: Skin is warm and dry.   Vitals:   02/21/18 1548  BP: 122/74  Pulse: (!) 56  Temp: 97.8 F (36.6 C)  TempSrc:  Oral  SpO2: 98%  Weight: 130 lb (59 kg)  Height: 5\' 5"  (1.651 m)      Assessment & Plan:

## 2018-02-22 DIAGNOSIS — H6121 Impacted cerumen, right ear: Secondary | ICD-10-CM | POA: Insufficient documentation

## 2018-02-22 NOTE — Assessment & Plan Note (Signed)
Ear disimpacted with instrument during the visit. Hearing restored to baseline after this.

## 2018-02-24 ENCOUNTER — Telehealth: Payer: Self-pay | Admitting: Internal Medicine

## 2018-02-24 MED ORDER — MIRTAZAPINE 7.5 MG PO TABS: 7.5000 mg | ORAL_TABLET | Freq: Every day | ORAL | 0 refills | Status: DC

## 2018-02-24 NOTE — Addendum Note (Signed)
Addended by: Berton LanGULCH, Kaedan Richert R on: 02/24/2018 10:26 AM   Modules accepted: Orders

## 2018-02-24 NOTE — Telephone Encounter (Signed)
Copied from CRM [id number]. Topic: General - Other >> Feb 24, 2018  8:30 AM Ronney LionArrington, Shykila A wrote: Medication: mirtazapine (REMERON)   Has the patient contacted their pharmacy? Yes   Agent: If yes, when and what did the pharmacy advise? No refills left, needs a order sent to the Pharmacy  Preferred Pharmacy (with phone number or street name):  [id number] [place] -- [id number] [facility] [address] [place] [id number] Phone: [phone] Fax: [phone]

## 2018-02-24 NOTE — Telephone Encounter (Signed)
Sent in small supply to get till mail order gets in patient informed

## 2018-02-24 NOTE — Telephone Encounter (Signed)
Contacted pt regarding refill request; chart says last refill for mirtazapine was last filled on 02/15/18 #90; the pt says that she has not received this medication, and she has talked to [facility]; the pt would like 5 pills to cover her until mail order arrives; will route to office for final disposition; the pt would like to be called back because she is going to the beach on 02/25/18; her contact number is [phone].     mirtazapine refill Last Refill:02/15/18 # 90 Last OV: 04/20/17 PCP: Dr Okey [facility]rawford [facility] [address] [place]

## 2018-04-19 ENCOUNTER — Ambulatory Visit (INDEPENDENT_AMBULATORY_CARE_PROVIDER_SITE_OTHER): Payer: Medicare Other | Admitting: Internal Medicine

## 2018-04-19 ENCOUNTER — Encounter: Payer: Self-pay | Admitting: Internal Medicine

## 2018-04-19 VITALS — BP 130/80 | HR 60 | Temp 97.4°F | Ht 65.0 in | Wt 133.0 lb

## 2018-04-19 DIAGNOSIS — R300 Dysuria: Secondary | ICD-10-CM | POA: Diagnosis not present

## 2018-04-19 LAB — POCT URINALYSIS DIPSTICK
Bilirubin, UA: NEGATIVE
Glucose, UA: NEGATIVE
Ketones, UA: NEGATIVE
Leukocytes, UA: NEGATIVE
NITRITE UA: NEGATIVE
PROTEIN UA: NEGATIVE
RBC UA: NEGATIVE
SPEC GRAV UA: 1.01 (ref 1.010–1.025)
Urobilinogen, UA: 0.2 E.U./dL
pH, UA: 6.5 (ref 5.0–8.0)

## 2018-04-19 MED ORDER — CIPROFLOXACIN HCL 500 MG PO TABS: 500.0000 mg | ORAL_TABLET | Freq: Two times a day (BID) | ORAL | 0 refills | Status: DC

## 2018-04-19 NOTE — Progress Notes (Signed)
   Subjective:    Patient ID: Rachel Ferguson, female    DOB: 1931-02-08, 84 y.o.   MRN: [id number]  HPI The patient is an 84 YO female coming in for burning with urination. Started about 2 days ago. She is also having some pressure in low abdomen. She denies fevers or chills. Denies cough or SOB. Denies chest pains. Denies nausea or vomiting. Denies new back pain. This does feel like a UTI and she has had these in the past. She normally takes cipro for this and denies that she has allergy to this and read allergy to her from her chart and she denies that this was represented right and can take this medicine. Originally could not urinate and then did not feel like trying until she was walking out of the office.   Review of Systems  Constitutional: Negative.   Respiratory: Negative.   Cardiovascular: Negative.   Gastrointestinal: Positive for abdominal pain. Negative for abdominal distention, constipation, diarrhea, nausea and vomiting.  Genitourinary: Positive for dysuria, frequency and urgency.  Musculoskeletal: Negative.   Skin: Negative.       Objective:   Physical Exam  Constitutional: She is oriented to person, place, and time. She appears well-developed and well-nourished.  HENT:  Head: Normocephalic and atraumatic.  Eyes: EOM are normal.  Neck: Normal range of motion.  Cardiovascular: Normal rate and regular rhythm.  Pulmonary/Chest: Effort normal and breath sounds normal. No respiratory distress. She has no wheezes. She has no rales.  Abdominal: Soft. Bowel sounds are normal. She exhibits no distension. There is no tenderness. There is no rebound.  Musculoskeletal: She exhibits no edema.  Neurological: She is alert and oriented to person, place, and time. Coordination normal.  Skin: Skin is warm and dry.  Psychiatric: She has a normal mood and affect.   Vitals:   04/19/18 1055  BP: 130/80  Pulse: 60  Temp: (!) 97.4 F (36.3 C)  TempSrc: Oral  SpO2: 99%  Weight: 133 lb (60.3  kg)  Height: 5\' 5"  (1.651 m)      Assessment & Plan:

## 2018-04-19 NOTE — Patient Instructions (Signed)
We have sent in ciprofloxacin to take 1 pill twice a day for 5 days.   

## 2018-04-20 DIAGNOSIS — R300 Dysuria: Secondary | ICD-10-CM | POA: Insufficient documentation

## 2018-04-20 NOTE — Assessment & Plan Note (Signed)
Rx for 5 day cipro course, POC U/A done in the office after visit.

## 2018-04-21 ENCOUNTER — Telehealth: Payer: Self-pay | Admitting: Internal Medicine

## 2018-04-21 NOTE — Telephone Encounter (Signed)
Copied from CRM [id number]. Topic: General - Other >> Apr 21, 2018 12:10 PM Leafy Ro wrote: Reason for CRM:pt saw dr Okey Dupre on 04-19-18 and she is being treated for uti. Pt is urinating a lot at night and having trouble sleeping. Pt would like something for sleep. [facility] [address]. Pt said she can only take AZO twice a day and pyridium only three times a day. Pt would like to know if she can take AZO and pyridium more often. Please call patient back. Pt was crying due to lack of sleep

## 2018-04-21 NOTE — Telephone Encounter (Signed)
I would not recommend to take both azo and pyridium. The pyridium is a medicine only for pain when urinating and does not change frequency of urination. Would recommend the azo only if she wants. Can use benadryl over the counter for sleep. Avoid liquids after 7pm.

## 2018-04-21 NOTE — Telephone Encounter (Signed)
FYI Patient informed of MD response and stated that the melatonin never worked and wanted Dr. Okey Dupre to know that. Patient got another phone call and hung up to answer that call

## 2018-04-21 NOTE — Telephone Encounter (Signed)
Can add melatonin over the counter to help and use chamomile oil or tea to help as well.

## 2018-04-21 NOTE — Telephone Encounter (Signed)
Informed patient of MD response and stated that in the past she had trouble sleeping it was like 3 days and was in tears and family took her into the doctor and was given a medication that helped sleep and is worried that it will get to that point again. States that she has been using the benadryl and will help get to sleep but then when having to get up to go to the bathroom will not be able to go back to sleep again.

## 2018-04-25 ENCOUNTER — Ambulatory Visit (INDEPENDENT_AMBULATORY_CARE_PROVIDER_SITE_OTHER): Payer: Medicare Other | Admitting: Internal Medicine

## 2018-04-25 ENCOUNTER — Encounter: Payer: Self-pay | Admitting: Internal Medicine

## 2018-04-25 VITALS — BP 130/82 | HR 69 | Temp 97.5°F | Ht 65.0 in

## 2018-04-25 DIAGNOSIS — T50901A Poisoning by unspecified drugs, medicaments and biological substances, accidental (unintentional), initial encounter: Secondary | ICD-10-CM | POA: Insufficient documentation

## 2018-04-25 DIAGNOSIS — F5101 Primary insomnia: Secondary | ICD-10-CM

## 2018-04-25 DIAGNOSIS — R413 Other amnesia: Secondary | ICD-10-CM | POA: Diagnosis not present

## 2018-04-25 MED ORDER — APOAEQUORIN 20 MG PO CAPS: 20.0000 mg | ORAL_CAPSULE | Freq: Every day | ORAL | 6 refills | Status: DC

## 2018-04-25 NOTE — Assessment & Plan Note (Signed)
She did take an unknown quantity of remeron on purpose to help her sleep. This was not in an attempt to overdose so is labeled unintentional. She is counseled that this is dangerous and she should never take medications outside of prescribed.

## 2018-04-25 NOTE — Patient Instructions (Signed)
The EKG of the heart is the same today. This is good as we talked about that medication can cause heart problems if you take too much.   Do not take more medication than prescribed or directed by a doctor. This can be very dangerous and can cause death. You were very lucky that you did not have problems with taking the medication.   In the future if you have sleep problems come in for advice as this is the fastest way to get answers.   Since you are sleeping back to normal now keep taking 1 pill at night time of the remeron and we can let pharmacy know that it is okay to fill early.

## 2018-04-25 NOTE — Assessment & Plan Note (Signed)
Taking remeron 7.5 mg daily. She has taken an unknown quantity of pills for an unknown amount of time. EKG done today to rule out QT prolongation which is not present today. Strong counseling given that it is not okay to take medications without advice from a doctor and it is never okay to take more than prescribed of medications. She is reminded that serious injury or death can occur from taking medications outside of prescription.

## 2018-04-25 NOTE — Progress Notes (Signed)
Subjective:    Patient ID: Rachel Ferguson, female    DOB: 01/28/1931, 84 y.o.   MRN: [id number]  HPI The patient is an 84 YO female coming in for concerns about sleeping. She was not able to sleep with the UTI because of getting up to urinate and not being able to sleep. She states that she did have an episode of this with last UTI years ago when she was in [place] and the doctor she was seeing then gave her some medicine and told her and family to give her pills every 20 minutes until she slept because she had been 3-4 days without any sleep at that time. After "a bunch" of those she went to sleep and then her sleep cycle reset. She had been calling the office and was advised to take benadryl or melatonin for sleep if needed. She states that she tried melatonin and it did nothing and takes benadryl at night anyway. She takes remeron 1 daily and so since she was not sleeping she took more. She cannot state which day this was. She felt that she was not sleeping well for 1-2 days and was worried about not being able to sleep again. She then took a handful of medication at night time. She cannot estimate if this was 5 or 20. She is now almost out of her remeron (cannot tell me how many nights she did this) which is not due for refill until mid-December. She first tells me that she did this last night too and then switches that to last night she took only 1. She is feeling some poorly currently. She is feeling a little off but cannot quantify how. She denies chest pains or SOB. She denies dizziness but balance feels some off. She denies SI/HI. Her urinary symptoms are currently gone and she is off ciprofloxacin.   Review of Systems  Constitutional: Positive for activity change and appetite change.  HENT: Negative.   Eyes: Negative.   Respiratory: Negative for cough, chest tightness and shortness of breath.   Cardiovascular: Negative for chest pain, palpitations and leg swelling.  Gastrointestinal: Negative  for abdominal distention, abdominal pain, constipation, diarrhea, nausea and vomiting.  Musculoskeletal: Negative.   Skin: Negative.   Neurological: Negative.   Psychiatric/Behavioral: Positive for confusion, decreased concentration and sleep disturbance. Negative for dysphoric mood, hallucinations, self-injury and suicidal ideas. The patient is not nervous/anxious and is not hyperactive.       Objective:   Physical Exam  Constitutional: She is oriented to person, place, and time. She appears well-developed and well-nourished.  HENT:  Head: Normocephalic and atraumatic.  Eyes: EOM are normal.  Neck: Normal range of motion.  Cardiovascular: Normal rate and regular rhythm.  Pulmonary/Chest: Effort normal and breath sounds normal. No respiratory distress. She has no wheezes. She has no rales.  Abdominal: Soft. Bowel sounds are normal. She exhibits no distension. There is no tenderness. There is no rebound.  Musculoskeletal: She exhibits no edema.  Neurological: She is alert and oriented to person, place, and time. Coordination normal.  Skin: Skin is warm and dry.  Psychiatric:  She is more confused today than usual with some timeline inconsistencies in her story. Balance is good, well kempt and well groomed.    Vitals:   04/25/18 0940  BP: 130/82  Pulse: 69  Temp: (!) 97.5 F (36.4 C)  TempSrc: Oral  SpO2: 98%  Height: 5\' 5"  (1.651 m)   EKG: Rate 64, axis normal, intervals normal including QT,  sinus, some negative ST precordial which are not new, no new ST or t wave changes, no change from 01/2018.    Assessment & Plan:

## 2018-04-25 NOTE — Assessment & Plan Note (Signed)
She is more confused today and suspect this is from unintentional overdose of remeron. She is given rx for prevagen per patient request.

## 2018-05-04 ENCOUNTER — Telehealth: Payer: Self-pay | Admitting: Internal Medicine

## 2018-05-04 ENCOUNTER — Other Ambulatory Visit: Payer: Self-pay

## 2018-05-04 MED ORDER — MIRTAZAPINE 7.5 MG PO TABS: 7.5000 mg | ORAL_TABLET | Freq: Every day | ORAL | 0 refills | Status: DC

## 2018-05-04 NOTE — Telephone Encounter (Signed)
Pt called and needs a refill of her REMERON. [facility] the rest sent to [facility]

## 2018-05-05 NOTE — Telephone Encounter (Signed)
smal supply sent to local pharmacy and called mail order pharmacy to allow and early refill

## 2018-08-01 ENCOUNTER — Other Ambulatory Visit: Payer: Self-pay | Admitting: Internal Medicine

## 2018-08-02 ENCOUNTER — Other Ambulatory Visit: Payer: Self-pay | Admitting: Internal Medicine

## 2018-08-18 ENCOUNTER — Telehealth: Payer: Self-pay | Admitting: Internal Medicine

## 2018-08-18 DIAGNOSIS — H9193 Unspecified hearing loss, bilateral: Secondary | ICD-10-CM

## 2018-08-18 NOTE — Telephone Encounter (Signed)
Referral placed.

## 2018-08-18 NOTE — Telephone Encounter (Signed)
Fine to place referral.  

## 2018-08-18 NOTE — Telephone Encounter (Signed)
Copied from CRM [id number]. Topic: General - Other >> Aug 18, 2018  9:55 AM Tamela Oddi wrote: Reason for CRM: Patient called to ask the doctor for a referral to [facility] for a hearing eval.  Their fax# is [phone].  If there are any questions, please call patient at [phone]

## 2018-08-19 ENCOUNTER — Other Ambulatory Visit: Payer: Self-pay | Admitting: Internal Medicine

## 2018-10-01 ENCOUNTER — Other Ambulatory Visit: Payer: Self-pay | Admitting: Internal Medicine

## 2018-10-02 ENCOUNTER — Telehealth: Payer: Self-pay | Admitting: Internal Medicine

## 2018-10-02 NOTE — Telephone Encounter (Signed)
The only thing that I think would work is a virtual visit with mother there and talk to Dr. Okey Dupre about what is going on. There is not anything we can do unless patient and daughter are together and talking about the concerns.

## 2018-10-02 NOTE — Telephone Encounter (Signed)
Copied from CRM [id number]. Topic: Quick Communication - See Telephone Encounter >> Oct 02, 2018 12:13 PM Louie Bun, Rosey Bath D wrote: CRM for notification. See Telephone encounter for: 10/02/18. Patient daughter Alan Ripper called and would like to talk to Dr. Okey Dupre about patients health and her alzheimer's. Please call daughter back if possible today or tomorrow.

## 2018-10-04 ENCOUNTER — Ambulatory Visit (INDEPENDENT_AMBULATORY_CARE_PROVIDER_SITE_OTHER): Payer: Medicare Other | Admitting: Internal Medicine

## 2018-10-04 DIAGNOSIS — G301 Alzheimer's disease with late onset: Secondary | ICD-10-CM | POA: Diagnosis not present

## 2018-10-04 DIAGNOSIS — F0280 Dementia in other diseases classified elsewhere without behavioral disturbance: Secondary | ICD-10-CM | POA: Diagnosis not present

## 2018-10-04 MED ORDER — LORAZEPAM 0.5 MG PO TABS: 0.5000 mg | ORAL_TABLET | Freq: Two times a day (BID) | ORAL | 0 refills | Status: DC | PRN

## 2018-10-04 NOTE — Telephone Encounter (Signed)
Left daughter VM to call back to give Briana's response.

## 2018-10-04 NOTE — Progress Notes (Signed)
Virtual Visit via Video Note  I connected with Rachel Ferguson on 10/04/18 at 11:00 AM EDT by a video enabled telemedicine application and verified that I am speaking with the correct person using two identifiers.   I discussed the limitations of evaluation and management by telemedicine and the availability of in person appointments. The patient expressed understanding and agreed to proceed.  History of Present Illness: The patient is a 85 y.o. female with visit for concerns of family members about behavior and memory. Started initially about 8 years or so ago after the death of spouse. She is currently living with son. Her daughter has noticed that she is not acting herself at all recently. She has been not sleeping well in the last couple of days. She has had an episode of diarrhea as well but this has not come back again. Denies fevers or chills. Denies taking too many medications. She is not sure that she is taking her medications. Her daughter is very concerned with her mental state as the patient will not accept that she has dementia and there are some behavioral issues now. She is thinking that the son is not taking care of her and accusing him of trying to change around her medicines which he is not doing. Overall this is worse in the last 1-2 months.  Observations/Objective: Appearance: frantic and not logical, breathing appears normal, casual grooming, abdomen does not appear distended, throat not examined, memory poor, mental status is A and O times 2  Assessment and Plan: See problem oriented charting  Follow Up Instructions: patient declines aricept, rx for lorazepam to help with sleep and behavioral problems  I discussed the assessment and treatment plan with the patient. The patient was provided an opportunity to ask questions and all were answered. The patient agreed with the plan and demonstrated an understanding of the instructions.   The patient was advised to call back or seek an  in-person evaluation if the symptoms worsen or if the condition fails to improve as anticipated.  Visit time 25 minutes: greater than 50% of that time was spent in face to face counseling and coordination of care with the patient: counseled about dementia as well as dementia in those with underlying higher intelligence causing acute worsening when compensation underlying can no longer manage and sometimes we see behavioral problems due to memory loss.   Myrlene Broker, MD

## 2018-10-05 ENCOUNTER — Telehealth: Payer: Self-pay

## 2018-10-05 ENCOUNTER — Encounter: Payer: Self-pay | Admitting: Internal Medicine

## 2018-10-05 NOTE — Assessment & Plan Note (Signed)
This has progressed significantly in the last year. Testing done Oct 2018 with mild to moderate symptoms and likely underestimated given her especially language intelligence. She is likely moderate at least at this time with some behavioral changes. Rx for ativan to help with sleep and night time disorientation. She is not able to live independently at this time.

## 2018-10-05 NOTE — Telephone Encounter (Signed)
PA started on CoverMyMeds KEY: Key: [id number]  Start Date:09/05/2018;Coverage End Date:10/05/2019

## 2018-10-05 NOTE — Telephone Encounter (Signed)
[facility] stated PA for Lorazepam 0.5mg  was approved for 09/05/18-10/05/19 Please advise.

## 2018-10-10 ENCOUNTER — Telehealth: Payer: Self-pay

## 2018-10-10 NOTE — Telephone Encounter (Signed)
Since daughter wants to speak with Dr. Okey Dupre as well can we get them scheduled for a doxy visit

## 2018-10-10 NOTE — Telephone Encounter (Signed)
Noted thanks °

## 2018-10-10 NOTE — Telephone Encounter (Signed)
Copied from CRM [id number]. Topic: General - Other >> Oct 10, 2018  8:51 AM Jaquita Rector A wrote: Reason for CRM: Patient called a little confused but stated that last night she was not able to sleep. She think the medications that she take for sleep is too much but it is not helping her to get any sleep. Patient states that she does not want to die but would like to be able to sleep again. She think that she would like to do another face time call with Dr Okey Dupre. Patient also stated that her daughter would like to speak to Dr Okey Dupre also. Please call patient at Ph# [phone] daughter Alan Ripper Ph# [phone] >> Oct 10, 2018 10:11 AM Marquis Buggy A wrote: Please advise, or follow up with patient.

## 2018-10-10 NOTE — Telephone Encounter (Signed)
She called again, We have set up a virtual visit for tomorrow with Dr.Crawford.

## 2018-10-11 ENCOUNTER — Other Ambulatory Visit (INDEPENDENT_AMBULATORY_CARE_PROVIDER_SITE_OTHER): Payer: Medicare Other

## 2018-10-11 ENCOUNTER — Encounter: Payer: Self-pay | Admitting: Internal Medicine

## 2018-10-11 ENCOUNTER — Ambulatory Visit (INDEPENDENT_AMBULATORY_CARE_PROVIDER_SITE_OTHER): Payer: Medicare Other | Admitting: Internal Medicine

## 2018-10-11 DIAGNOSIS — G301 Alzheimer's disease with late onset: Secondary | ICD-10-CM | POA: Diagnosis not present

## 2018-10-11 DIAGNOSIS — F0280 Dementia in other diseases classified elsewhere without behavioral disturbance: Secondary | ICD-10-CM | POA: Diagnosis not present

## 2018-10-11 DIAGNOSIS — E038 Other specified hypothyroidism: Secondary | ICD-10-CM

## 2018-10-11 LAB — URINALYSIS, ROUTINE W REFLEX MICROSCOPIC
Bilirubin Urine: NEGATIVE
Hgb urine dipstick: NEGATIVE
Ketones, ur: NEGATIVE
Leukocytes,Ua: NEGATIVE
Nitrite: NEGATIVE
RBC / HPF: NONE SEEN (ref 0–?)
Specific Gravity, Urine: 1.01 (ref 1.000–1.030)
Total Protein, Urine: NEGATIVE
Urine Glucose: NEGATIVE
Urobilinogen, UA: 0.2 (ref 0.0–1.0)
pH: 5.5 (ref 5.0–8.0)

## 2018-10-11 LAB — TSH: TSH: 5.59 u[IU]/mL — ABNORMAL HIGH (ref 0.35–4.50)

## 2018-10-11 LAB — COMPREHENSIVE METABOLIC PANEL
ALT: 15 U/L (ref 0–35)
AST: 22 U/L (ref 0–37)
Albumin: 4.5 g/dL (ref 3.5–5.2)
Alkaline Phosphatase: 66 U/L (ref 39–117)
BUN: 26 mg/dL — ABNORMAL HIGH (ref 6–23)
CO2: 29 mEq/L (ref 19–32)
Calcium: 9.4 mg/dL (ref 8.4–10.5)
Chloride: 101 mEq/L (ref 96–112)
Creatinine, Ser: 0.96 mg/dL (ref 0.40–1.20)
GFR: 55.08 mL/min — ABNORMAL LOW (ref >60.00)
Glucose, Bld: 105 mg/dL — ABNORMAL HIGH (ref 70–99)
Potassium: 4.1 mEq/L (ref 3.5–5.1)
Sodium: 138 mEq/L (ref 135–145)
Total Bilirubin: 0.4 mg/dL (ref 0.2–1.2)
Total Protein: 7.3 g/dL (ref 6.0–8.3)

## 2018-10-11 LAB — VITAMIN D 25 HYDROXY (VIT D DEFICIENCY, FRACTURES): VITD: 52.48 ng/mL (ref 30.00–100.00)

## 2018-10-11 LAB — CBC
HCT: 40 % (ref 36.0–46.0)
Hemoglobin: 13.4 g/dL (ref 12.0–15.0)
MCHC: 33.6 g/dL (ref 30.0–36.0)
MCV: 97.7 fl (ref 78.0–100.0)
Platelets: 198 10*3/uL (ref 150.0–400.0)
RBC: 4.09 Mil/uL (ref 3.87–5.11)
RDW: 12.2 % (ref 11.5–15.5)
WBC: 6.4 10*3/uL (ref 4.0–10.5)

## 2018-10-11 LAB — VITAMIN B12: Vitamin B-12: 474 pg/mL (ref 211–911)

## 2018-10-11 LAB — T4, FREE: Free T4: 1.11 ng/dL (ref 0.60–1.60)

## 2018-10-11 MED ORDER — LORAZEPAM 0.5 MG PO TABS: 0.5000 mg | ORAL_TABLET | Freq: Two times a day (BID) | ORAL | 3 refills | Status: DC | PRN

## 2018-10-11 NOTE — Assessment & Plan Note (Signed)
Refill lorazepam and referral to neurology per son request. Checking labs and urine to rule out reversible cause to change in mental state. It is more likely that she is just unable to compensate any longer and is having decline due to higher IQ and literacy to start.

## 2018-10-11 NOTE — Progress Notes (Signed)
Virtual Visit via Video Note  I connected with Rachel Ferguson on 10/11/18 at 11:00 AM EDT by a video enabled telemedicine application and verified that I am speaking with the correct person using two identifiers.   I discussed the limitations of evaluation and management by telemedicine and the availability of in person appointments. The patient expressed understanding and agreed to proceed.  History of Present Illness: The patient is a 85 y.o. female with visit for dementia with worsening. Started in the last couple of weeks with her feeling like she is not sleeping well. She is living with her son and has for some time. Daughter denies fevers or chills. Denies urinary incontinence. Sleeping some during the day. They perceive that she is sleeping okay at night. She has visit about 1 week ago and started using lorazepam as needed for the behavioral problems. She has done okay with this. They feel she is sleeping better but mental state is not improved. They would like to check labs and urine if possible.   Observations/Objective: Appearance: slightly disoriented, breathing appears normal, casual grooming, abdomen does not appear distended, memory poor, mental status is awake and alert  Assessment and Plan: See problem oriented charting  Follow Up Instructions: refill lorazepam and ordered labs and urine  I discussed the assessment and treatment plan with the patient. The patient was provided an opportunity to ask questions and all were answered. The patient agreed with the plan and demonstrated an understanding of the instructions.   The patient was advised to call back or seek an in-person evaluation if the symptoms worsen or if the condition fails to improve as anticipated.  Myrlene Broker, MD   Visit time 25 minutes: greater than 50% of that time was spent in face to face counseling and coordination of care with the patient: counseled about dementia and causes of sudden changes as well as  potential benefit to neurology referral and they would like to proceed with that

## 2018-10-12 ENCOUNTER — Telehealth: Payer: Self-pay | Admitting: Internal Medicine

## 2018-10-12 ENCOUNTER — Ambulatory Visit: Payer: Self-pay

## 2018-10-12 NOTE — Telephone Encounter (Signed)
Okay to stop omeprazole if not having heart burn symptoms.

## 2018-10-12 NOTE — Telephone Encounter (Signed)
I have received FMLA forms for daughter Rachel Ferguson. At this time she is requesting intermitting leave at less 2 days a week 12 hours shifts. Please advise if you approve and how long this should last. Thank you.

## 2018-10-12 NOTE — Telephone Encounter (Signed)
Forms have completed & placed in providers box to review and sign.

## 2018-10-12 NOTE — Telephone Encounter (Signed)
FMLA fine 6 months

## 2018-10-12 NOTE — Telephone Encounter (Signed)
Patient daughter informed.

## 2018-10-12 NOTE — Telephone Encounter (Signed)
Pt's daughter called stating at a virtual visit that she mentioned to Dr Okey Dupre if her mother needs to continue her Omeprazole. Pt's daughter stated Dr Okey Dupre stated that she no longer needed to take the med if she is no longer having symptoms.  Per chart review, no documentation of conversation nor was the medication d/c'd.  Please review and advise pt/daughter.  Reason for Disposition . Caller has NON-URGENT medication question about med that PCP prescribed and triager unable to answer question  Answer Assessment - Initial Assessment Questions 1. SYMPTOMS: "Do you have any symptoms?"     no 2. SEVERITY: If symptoms are present, ask "Are they mild, moderate or severe?"     N/a  See note.  Protocols used: MEDICATION QUESTION CALL-A-AH

## 2018-10-13 DIAGNOSIS — Z0279 Encounter for issue of other medical certificate: Secondary | ICD-10-CM

## 2018-10-13 NOTE — Telephone Encounter (Signed)
Forms have been signed, Faxed, Copy sent to scan & charged for.   Daughter informed, Will pick up original.

## 2018-10-24 ENCOUNTER — Telehealth: Payer: Self-pay | Admitting: Neurology

## 2018-10-24 NOTE — Telephone Encounter (Signed)
Patient's daughter left message with after hours service regarding wanting her mom to have an MRI. She said her mom is on a [place]ward climb and that she got lost walking yesterday. Her gait is changing. Please Call. Thanks

## 2018-10-25 NOTE — Telephone Encounter (Signed)
Pls let daughter know patient has not been seen in more than a year. We cannot order an MRI without evaluating her, insurance will not approve it. Pls schedule e-visit, Thanks

## 2018-10-25 NOTE — Telephone Encounter (Signed)
Pt is scheduled for 1pm on friday

## 2018-10-25 NOTE — Telephone Encounter (Signed)
Pt daughter called no answer voice mail left for her to call back  

## 2018-10-26 ENCOUNTER — Other Ambulatory Visit: Payer: Self-pay

## 2018-10-27 ENCOUNTER — Telehealth (INDEPENDENT_AMBULATORY_CARE_PROVIDER_SITE_OTHER): Payer: Medicare Other | Admitting: Neurology

## 2018-10-27 DIAGNOSIS — G301 Alzheimer's disease with late onset: Secondary | ICD-10-CM

## 2018-10-27 DIAGNOSIS — F02818 Dementia in other diseases classified elsewhere, unspecified severity, with other behavioral disturbance: Secondary | ICD-10-CM

## 2018-10-27 DIAGNOSIS — F0281 Dementia in other diseases classified elsewhere with behavioral disturbance: Secondary | ICD-10-CM

## 2018-10-27 MED ORDER — MIRTAZAPINE 7.5 MG PO TABS: ORAL_TABLET | ORAL | 3 refills | Status: DC

## 2018-10-27 NOTE — Progress Notes (Signed)
Virtual Visit via Video Note The purpose of this virtual visit is to provide medical care while limiting exposure to the novel coronavirus.    Consent was obtained for video visit:  Yes.   Answered questions that patient had about telehealth interaction:  Yes.   I discussed the limitations, risks, security and privacy concerns of performing an evaluation and management service by telemedicine. I also discussed with the patient that there may be a patient responsible charge related to this service. The patient expressed understanding and agreed to proceed.  Pt location: Home Physician Location: office Name of referring provider:  Myrlene BrokerCrawford, Elizabeth A, * I connected with Fernand ParkinsJulia Weatherly at patients initiation/request on 10/27/2018 at  1:00 PM EDT by video enabled telemedicine application and verified that I am speaking with the correct person using two identifiers. Pt MRN:  [id number] Pt DOB:  12/18/1930 Video Participants:  Fernand ParkinsJulia Madia;  Jenelle MagesJohn Kolek (son), Alan RipperElizabeth Koster (daughter)   History of Present Illness:  The patient was last seen in January 2019 for Alzheimer's dementia. She has no insight into her condition and was argumentative on her last visit, asking for a second opinion. She had previously discontinued Donepezil after reading about side effects on [employer]. She presents today for follow-up due to rapid worsening of symptoms 4 weeks ago. She started having difficulty sleeping. She has been living with her son Jonny RuizJohn for the past 4 years, then started thinking that Jonny RuizJohn is not taking care of her and accusing him of trying to change around her medications, which he is not doing. Rachel Ferguson reports a sharp decline in a lot of her cognitive abilities, she is having more trouble coming up with words, having intermittent slurring of speech, and elevated levels of agitation and uncontrollable crying spells. After talking to her sister in [place], [place], she hung up the phone and thought she herself was  in [place]. She was prescribed lorazepam 0.5mg  TID which she has been taking at bedtime, stating that it helps her sleep a lot better than the mirtazapine 7.5mg  qhs. Rachel Ferguson also reports that she used to walk 10,000 steps but now gets tired very easily, wanting to stay in bed a lot, which is unusual for her. Some days are better than others. She has also gotten lost twice walking around the block alone. Sometimes when walking, she veers to the right and likes to lean on people. No focal weakness noted. Ms. Chilton SiGreen started becoming upset with Rachel ManisElizabeth, stating that she did get upset with her son a while but but "most of the time I have a wonderful son and would love to continue to live with him." She admits to having more memory difficulties since she started having sleep difficulties and that she when does sleep, she remembers better. She is hyperfocused today on the lorazepam, requiring repeated explanation of why she could not take more lorazepam than prescribed. She apparently took 1 lorazepam at noon yesterday, then 2 last night, in addition to mirtazapine. She became upset stating she does not take it during the daytime, Rachel Ferguson reminded her and she states she was upset yesterday and does not remember what she was upset about. Family reports that she was crying yesterday when speaking to HigbeeElizabeth, and the crying is "pretty much a daily thing." She does not remember this. John reports that sometimes in the morning she would say she did not sleep and wants to sleep during the day, asking to take lorazepam and mirtazapine at 11am.   She denies  any headaches, dizziness, double vision, focal numbness/tingling/weakness, bowel/bladder dysfunction. Speech is clear today, she states her speech is generally pretty good. No visual/auditory hallucinations.   HPI 01/03/17: This is an 85 yo RH woman with a history of hypertension, hypothyroidism, depression, who presented for evaluation of dizziness and word-finding  difficulties. She was admitted to [facility] June 2074for dizziness worse with movements. She had an MRI brain that was reportedly normal. TSH normal. She drove herself to her hair appointment and as she walked in, she had another spell where she felt she had to lay down. She lay down and noticed her heart rate on the Apple watch was 158 bpm. Symptoms lasted a few minutes, she called her PCP who instructed her to go to the ER. She kept repeating during the visit that it was a big mistake to go to the ER. During her hospitalization, it was noted that she became extremely combative, yelling at nursing staff and physicians, stating she just wanted to sleep and be left alone. She could not comprehend why testing was needed to be done for the dizziness. She finally agreed to an echocardiogram, which was normal, EF 60-65%. There was no arrhythmia on telemetry for over 12 hours, it was agreed to discharge her home and had an outpatient event monitor. She has refused this and states she has not had any further dizziness or other issues. She was taken off her BP medication on last PCP visit. She does recall her hospital stay, saying her head was not quite right, not spinning, "like a funny feeling," no headache, tinnitus, nausea/vomiting, focal numbness/tingling. She states "I wish I had not gone, I'm sorry I did." She reports BP has been better, she has been travelling and feels normal. She reports her memory "could be better," she does not she forgets events but had a strenuous trip. She lives with her son and grandson, who are not present today. According to her daughter, she started noticing gradual memory decline since the patient's spouse passed away 6 years ago. She loses her phone all the time. She forgot her medications maybe once. Her daughter reports she has lost her car several times, the patient gets upset and reports it has only happened twice, and there was a reason for them. One time she could not find her car  after seeing her doctor in June. Her other son has had to help her find her car a few times. She gets more disoriented driving, especially in [place], where she used to live for many years. She states she has a wonderful GPS. Her children report that if GPS is unclear, she has a problem. Even going to the doctor's office, she could not remember exactly where it was. She has good and bad days. She got upset when her daughter reported problems with executive functioning, citing an instance with unpacking boxes into their remodeled house. She was "leaving trails" everywhere, she states she was trying to get order out of chaos. Her daughter reports impulsivity and concerns about her judgement. She has never struggled with anxiety, but several times recently, she has gotten panicked. One instance was when she was on the way to the hospital, she was anxious and panicked. She denies any missed bill payments. She denies any headaches, any further dizziness, diplopia, dysarthria/dysphagia, neck/back pain, focal numbness/tingling/weakness, bowel/bladder dysfunction, anosmia, or tremors. No difficulties with ADLs, no hallucinations. Her older sister had dementia. She denies any history of significant head injuries. She drinks at least one glass  of red wine most every night.   Diagnostic Data: Neuropsychological report indicates "Specific areas of cognitive impairment included semantic retrieval, comprehension of complex auditory material, and executive functioning (mental flexibility, set shifting, clock drawing). While memory consolidation was intact, the amount of information that could be encoded was reduced. The patient's level of cognitive impairment, alongside changes in daily functioning (e.g., driving, management of finances, appointments), meet diagnostic criteria for a dementia syndrome. Of note, because of her above-average intellectual abilities, her testing results may even over-estimate her true abilities. I  would still characterize stage of dementia as mild, approaching moderate." Taken together with cognitive profile concerning for medial temporal lobe involvement and MRI showing only mild small vessel disease, results support a diagnosis of Alzheimer's disease.   Observations/Objective:   General: not in acute distress. Patient started getting agitated during the visit, repeatedly asking why she cannot take lorazepam despite repeat explanations from this provider and family.  Neurological exam: Patient is awake, alert, oriented x 3. No aphasia or dysarthria. Intact fluency and comprehension. Remote and recent memory impaired. Able to name and repeat. Cranial nerves: Extraocular movements intact with no nystagmus. No facial asymmetry. Motor: moves all extremities symmetrically, at least anti-gravity x 4. No incoordination on finger to nose testing. Gait: narrow-based and steady, slight difficulty with tandem walk. Positive sway with Romberg test. Montreal Cognitive Assessment  10/30/2018 (MOCA Blind) 01/03/2017  Visuospatial/ Executive (0/5) 0 3  Naming (0/3) 0 2  Attention: Read list of digits (0/2) 2 2  Attention: Read list of letters (0/1) 0 1  Attention: Serial 7 subtraction starting at 100 (0/3) 1 3  Language: Repeat phrase (0/2) 1 2  Language : Fluency (0/1) 0 1  Abstraction (0/2) 0 2  Delayed Recall (0/5) 2 5  Orientation (0/6) 4 6  Total 10/22 27     Assessment and Plan:   This is an 85 yo RH woman with a history of hypertension, hypothyroidism, depression, with dementia, most likely due to Alzheimer's disease (diagnosed in October 2018 Neuropsychological evaluation). MRI brain no acute changes, mild chronic microvascular disease. She has no insight into her condition and becomes very defensive and agitated easily. MOCA score today 10/22 (MOCA Blind-done over the phone). Prior MOCA in 12/2016 was 27/30. Family was concerned about rapid decline around 4 weeks ago which occurred in the setting  of poor sleep, loss of interest in prior activities, crying spells. We discussed depression and how this can worsen cognition. She is hyperfocused on taking the lorazepam since it has helped her sleep, I discussed the potential for tolerance and dependence on this medication, would minimize/avoid as much as possible. Discussed increasing the mirtazapine 7.5mg  to 2 tabs qhs for a week, then 3 tabs qhs. We discussed that she should not take the mirtazapine or lorazepam in the daytime to help her sleep, since we want to avoid further disrupting day/night cycle and help her sleep better. She will follow-up in 6 months and knows to call for any changes.   Follow Up Instructions: -I discussed the assessment and treatment plan with the patient/family. The patient/family were provided an opportunity to ask questions and all were answered. The patient/family agreed with the plan and demonstrated an understanding of the instructions.   The patient was advised to call back or seek an in-person evaluation if the symptoms worsen or if the condition fails to improve as anticipated.    Total Time spent in visit with the patient was 35 minutes, of which more  than 50% of the time was spent in counseling and/or coordinating care on the above.   Pt understands and agrees with the plan of care outlined.     Van Clines, MD

## 2018-10-30 ENCOUNTER — Other Ambulatory Visit: Payer: Self-pay | Admitting: Internal Medicine

## 2018-11-27 ENCOUNTER — Telehealth: Payer: Self-pay | Admitting: Internal Medicine

## 2018-11-27 NOTE — Telephone Encounter (Signed)
Placed in Md folder to look over and fill out

## 2018-11-27 NOTE — Telephone Encounter (Signed)
Patient dropped off a Department of Transportation form to be completed by Dr Sharlet Salina due to determine if the patient can drive based on "cognitive impairment".  Patient would like a call back when this has been completed.   Placed in Dr Nathanial Millman box.

## 2018-11-28 ENCOUNTER — Ambulatory Visit (INDEPENDENT_AMBULATORY_CARE_PROVIDER_SITE_OTHER)
Admission: RE | Admit: 2018-11-28 | Discharge: 2018-11-28 | Disposition: A | Discharge status: Home / Self Care | Payer: Medicare Other | Source: Ambulatory Visit | Attending: Family Medicine | Admitting: Family Medicine

## 2018-11-28 ENCOUNTER — Encounter: Payer: Self-pay | Admitting: Family Medicine

## 2018-11-28 ENCOUNTER — Ambulatory Visit (INDEPENDENT_AMBULATORY_CARE_PROVIDER_SITE_OTHER): Payer: Medicare Other | Admitting: Family Medicine

## 2018-11-28 ENCOUNTER — Other Ambulatory Visit: Payer: Self-pay

## 2018-11-28 VITALS — BP 106/70 | HR 58 | Ht 64.5 in | Wt 127.0 lb

## 2018-11-28 DIAGNOSIS — G8929 Other chronic pain: Secondary | ICD-10-CM

## 2018-11-28 DIAGNOSIS — M549 Dorsalgia, unspecified: Secondary | ICD-10-CM

## 2018-11-28 DIAGNOSIS — M5441 Lumbago with sciatica, right side: Secondary | ICD-10-CM

## 2018-11-28 MED ORDER — GABAPENTIN 100 MG PO CAPS: 200.0000 mg | ORAL_CAPSULE | Freq: Every day | ORAL | 3 refills | Status: DC

## 2018-11-28 NOTE — Assessment & Plan Note (Signed)
Low back pain.  Mild radicular symptoms down the right leg.  We discussed with patient in great length.  We discussed home exercises, icing regimen.  We discussed the possibility of Toradol and Depo-Medrol.  Patient declined that today.  Patient does not attend gabapentin and I think it will be beneficial for more of it.  We discussed that if any increasing confusion we will need to monitor closely.  Patient also going to do a short course of anti-inflammatory for 3 days.  Follow-up with me again in 2 weeks

## 2018-11-28 NOTE — Progress Notes (Signed)
Rachel ScaleZach Ferguson D.O. [facility] [address] [place], [place] [address] Phone: [phone] Subjective:   I Rachel NighKana Ferguson am serving as Ferguson Neurosurgeonscribe for Dr. Antoine PrimasZachary Ferguson.  I'm seeing this patient by the request  of: Rachel Brokerrawford, Rachel A, MD    CC: Low back pain  MWN:[id number]:Subjective  Rachel ParkinsJulia Ferguson is Ferguson 85 y.o. female coming in with complaint of back pain. Has been to Ferguson chiropractor today. Pain is better when she is sitting upright.  Patient states initially happened when she was on vacation and slept in Ferguson poor bed.  Feels like it put the significant stress on her lower back.  Since then having worsening pain.  Rates the severity of pain sometimes Ferguson 7 out of 10.  Onset- 2-3 weeks  Location - lower back Duration-daily for the last 2 to 3 weeks Character-aching sensation with Ferguson sharp pain with certain movements Aggravating factors- sleeping Therapies tried- ice     Past Medical History:  Diagnosis Date  . Colon polyps   . GERD (gastroesophageal reflux disease)   . Migraines   . Osteopenia   . Thyroid disease    Past Surgical History:  Procedure Laterality Date  . KNEE SURGERY Right   . TONSILLECTOMY AND ADENOIDECTOMY     Social History   Socioeconomic History  . Marital status: Widowed    Spouse name: Not on file  . Number of children: 3  . Years of education: 5316  . Highest education level: Not on file  Occupational History  . Occupation: retired Runner, broadcasting/film/videoteacher  . Occupation: Clinical research associatewriter  Social Needs  . Financial resource strain: Not hard at all  . Food insecurity    Worry: Never true    Inability: Never true  . Transportation needs    Medical: No    Non-medical: No  Tobacco Use  . Smoking status: Former Smoker    Packs/day: 0.25    Years: 5.00    Pack years: 1.25    Quit date: 05/05/1956    Years since quitting: 62.6  . Smokeless tobacco: Never Used  Substance and Sexual Activity  . Alcohol use: Yes    Alcohol/week: 14.0 standard drinks    Types: 14 Standard  drinks or equivalent per week    Comment: 1-2 glasses red wine nightly  . Drug use: No  . Sexual activity: Never  Lifestyle  . Physical activity    Days per week: 5 days    Minutes per session: 50 min  . Stress: Only Ferguson little  Relationships  . Social connections    Talks on phone: More than three times Ferguson week    Gets together: More than three times Ferguson week    Attends religious service: Not on file    Active member of club or organization: Not on file    Attends meetings of clubs or organizations: Not on file    Relationship status: Not on file  Other Topics Concern  . Not on file  Social History Narrative   Lives with one of her sons and grandson in [place] 2 story home but stays on the first floor.  Has 3 children.  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  Also Ferguson Clinical research associatewriter.  Education: college.    Allergies  Allergen Reactions  . Ciprofloxacin     Had knee pain after taking Cipro. Had knee pain after taking Cipro. Other reaction(s): Other (See Comments) Had knee pain after taking Cipro.   Family History  Problem Relation Age  of Onset  . Cancer Mother        breast  . Heart disease Father   . Alzheimer's disease Sister   . Heart disease Maternal Grandmother   . Tuberculosis Paternal Grandmother   . Cancer Sister   . Melanoma Sister     Current Outpatient Medications (Endocrine & Metabolic):  .  levothyroxine (SYNTHROID) 75 MCG tablet, Take 1 tablet (75 mcg total) by mouth daily before breakfast.      Current Outpatient Medications (Other):  .  Apoaequorin (PREVAGEN EXTRA STRENGTH) 20 MG CAPS, Take 20 mg by mouth daily. Marland Kitchen.  LORazepam (ATIVAN) 0.5 MG tablet, Take 1 tablet (0.5 mg total) by mouth 3 times/day as needed-between meals & bedtime for anxiety. .  mirtazapine (REMERON) 7.5 MG tablet, Take 3 tablets every night .  Multiple Vitamin (MULTI-VITAMIN PO), Take 1 tablet by mouth. .  Multiple Vitamins-Minerals (PRESERVISION AREDS PO), Take 1 tablet by mouth daily. Marland Kitchen.   omeprazole (PRILOSEC) 20 MG capsule, TAKE 1 CAPSULE DAILY .  gabapentin (NEURONTIN) 100 MG capsule, Take 2 capsules (200 mg total) by mouth at bedtime.    Past medical history, social, surgical and family history all reviewed in electronic medical record.  No pertanent information unless stated regarding to the chief complaint.   Review of Systems:  No headache, visual changes, nausea, vomiting, diarrhea, constipation, dizziness, abdominal pain, skin rash, fevers, chills, night sweats, weight loss, swollen lymph nodes, body aches, joint swelling,  chest pain, shortness of breath, mood changes.  Positive muscle aches  Objective  Blood pressure 106/70, pulse (!) 58, height 5' 4.5" (1.638 m), weight 127 lb (57.6 kg), SpO2 96 %.   General: No apparent distress alert patient appears to have some dementia with some difficulty with orientation hard of hearing mood and affect normal, dressed appropriately.  HEENT: Pupils equal, extraocular movements intact  Respiratory: Patient's speak in full sentences and does not appear short of breath  Cardiovascular: Trace lower extremity edema, non tender, no erythema  Skin: Warm dry intact with no signs of infection or rash on extremities or on axial skeleton.  Abdomen: Soft nontender  Neuro: Cranial nerves II through XII are intact, neurovascularly intact in all extremities with 2+ DTRs and 2+ pulses.  Lymph: No lymphadenopathy of posterior or anterior cervical chain or axillae bilaterally.  Gait normal with good balance and coordination.  MSK:  Non tender with full range of motion and good stability and symmetric strength and tone of shoulders, elbows, wrist, hip, knee and ankles bilaterally.    Patient back exam shows increased kyphosis of the upper back with some degenerative scoliosis of the lumbar spine.  Loss of lordosis.  Tightness with Pearlean BrownieFaber test bilaterally.  Patient does have radicular symptoms down the right leg in the L4-L5 distribution with  straight leg test at 25 degrees.  Deep tendon reflexes are intact.  97110; 15 additional minutes spent for Therapeutic exercises as stated in above notes.  This included exercises focusing on stretching, strengthening, with significant focus on eccentric aspects.   Long term goals include an improvement in range of motion, strength, endurance as well as avoiding reinjury. Patient's frequency would include in 1-2 times Ferguson day, 3-5 times Ferguson week for Ferguson duration of 6-12 weeks. Low back exercises that included:  Pelvic tilt/bracing instruction to focus on control of the pelvic girdle and lower abdominal muscles  Glute strengthening exercises, focusing on proper firing of the glutes without engaging the low back muscles Proper stretching techniques for  maximum relief for the hamstrings, hip flexors, low back and some rotation where tolerated   Proper technique shown and discussed handout in great detail with ATC.  All questions were discussed and answered.   Impression and Recommendations:     This case required medical decision making of moderate complexity. The above documentation has been reviewed and is accurate and complete Lyndal Pulley, DO       Note: This dictation was prepared with Dragon dictation along with smaller phrase technology. Any transcriptional errors that result from this process are unintentional.

## 2018-11-28 NOTE — Patient Instructions (Addendum)
Good to see you.  Xray downstairs  Ice 20 minutes 2 times daily. Usually after activity and before bed. Exercises 3 times a week.   Gabapentin 200 mg at night this is a rescription and should help with the nerve pain  Turmeric 500mg  daily  Tart cherry extract 1200mg  at night Vitamin D 2000 IU daily  Have fun at the beach  See me again in 2-3 weeks

## 2018-11-30 ENCOUNTER — Telehealth: Payer: Self-pay | Admitting: Family Medicine

## 2018-11-30 MED ORDER — ACYCLOVIR 400 MG PO TABS: 400.0000 mg | ORAL_TABLET | Freq: Three times a day (TID) | ORAL | 0 refills | Status: DC

## 2018-11-30 NOTE — Telephone Encounter (Signed)
Done

## 2018-11-30 NOTE — Telephone Encounter (Signed)
Patient calling stating Dr. Tamala Julian was supposed to call in Aylouir 500mg  and would like a call once this is sent to the pharmacy.

## 2018-12-01 ENCOUNTER — Telehealth: Payer: Self-pay | Admitting: Internal Medicine

## 2018-12-01 NOTE — Telephone Encounter (Signed)
Patient calling and states that she received a letter in the mail stating that Dr Sharlet Salina does not want her driving. Patient states that she is very upset about this and wants to know why she cannot drive. Please advise.  CB#: [phone]

## 2018-12-04 NOTE — Telephone Encounter (Signed)
Patient can make an appointment to discuss. As long as symptom free

## 2018-12-04 NOTE — Telephone Encounter (Signed)
Spoke with pt, she is out of town and will call back when she gets home to schedule appt.

## 2018-12-11 ENCOUNTER — Encounter: Payer: Self-pay | Admitting: Internal Medicine

## 2018-12-11 ENCOUNTER — Telehealth: Payer: Self-pay

## 2018-12-11 ENCOUNTER — Ambulatory Visit (INDEPENDENT_AMBULATORY_CARE_PROVIDER_SITE_OTHER): Payer: Medicare Other | Admitting: Internal Medicine

## 2018-12-11 DIAGNOSIS — G301 Alzheimer's disease with late onset: Secondary | ICD-10-CM

## 2018-12-11 DIAGNOSIS — F0280 Dementia in other diseases classified elsewhere without behavioral disturbance: Secondary | ICD-10-CM | POA: Diagnosis not present

## 2018-12-11 DIAGNOSIS — E039 Hypothyroidism, unspecified: Secondary | ICD-10-CM | POA: Diagnosis not present

## 2018-12-11 NOTE — Progress Notes (Signed)
Virtual Visit via Audio Note  I connected with Rachel Ferguson on 12/11/18 at 11:00 AM EDT by an audio-only enabled telemedicine application and verified that I am speaking with the correct person using two identifiers.  The patient and the provider were at separate locations throughout the entire encounter.   I discussed the limitations of evaluation and management by telemedicine and the availability of in person appointments. The patient expressed understanding and agreed to proceed.  History of Present Illness: The patient is a 85 y.o. female with visit for concerns about DMV form. She had wanted this filled out so that she could continue driving however we did not give this permission. She has had several visits recently for severe change to her mental status and overwhelming confusion. She did not seem safe to be driving. She does not agree with diagnosis of dementia but has behaviors that would strongly suggest this as well as prior testing which also indicated loss of executive function. She is living with her son currently and is not managing her own finances currently. The patient was alone on the phone and did not recall the reason for her visit. She admits to doing well recently without concerns or questions about her care. Recent labs reviewed with her.   Observations/Objective: Voice strong, awake and alert, did not want to answer orientation or memory related questions. No cough during visit  Assessment and Plan: See problem oriented charting  Follow Up Instructions: no changes today  Visit time 11 minutes: that time was spent in face to face counseling and coordination of care with the patient: counseled about as above  I discussed the assessment and treatment plan with the patient. The patient was provided an opportunity to ask questions and all were answered. The patient agreed with the plan and demonstrated an understanding of the instructions.   The patient was advised to call  back or seek an in-person evaluation if the symptoms worsen or if the condition fails to improve as anticipated.  Hoyt Koch, MD

## 2018-12-11 NOTE — Telephone Encounter (Signed)
Patient has appointment to discuss tomorrow

## 2018-12-11 NOTE — Telephone Encounter (Signed)
Copied from [place] [id number]. Topic: General - Other >> Dec 11, 2018  8:14 AM Rachel Ferguson wrote: Reason for CRM: Pt called and is requesting to know why she was put on Ferguson no driving list. Pt states she does not feel anything related to the shingles. Please advise.

## 2018-12-11 NOTE — Telephone Encounter (Signed)
Copied from [place] [id number]. Topic: Quick Communication - See Telephone Encounter >> Dec 11, 2018 12:18 PM Loma [place] wrote: CRM for notification. See Telephone encounter for: 12/11/18. PT spoke with Dr Sharlet Salina today and she just realized that she has been told not to drive and wants to know why. Wants fu for better understanding at

## 2018-12-11 NOTE — Assessment & Plan Note (Signed)
Do not feel that patient should be driving. She did not have any questions about this today and forgot that she called and made this appointment specifically about this reason which reinforces my decision that her memory is not such to allow driving.

## 2018-12-11 NOTE — Telephone Encounter (Signed)
Noted thank you

## 2018-12-11 NOTE — Telephone Encounter (Signed)
Patient was told last week to make an appointment to discuss with Dr. Sharlet Salina.

## 2018-12-11 NOTE — Assessment & Plan Note (Signed)
Reviewed recent labs and at goal. Synthroid 75 mcg daily will be continued.

## 2018-12-11 NOTE — Telephone Encounter (Signed)
Patient's daughter is unable to come help with virtual visit. Insurance does cover a phone call. A call appointment has been set up for today 6/29 @11am .

## 2018-12-12 ENCOUNTER — Encounter: Payer: Self-pay | Admitting: Internal Medicine

## 2018-12-12 ENCOUNTER — Ambulatory Visit (INDEPENDENT_AMBULATORY_CARE_PROVIDER_SITE_OTHER): Payer: Medicare Other | Admitting: Internal Medicine

## 2018-12-12 ENCOUNTER — Telehealth: Payer: Self-pay | Admitting: Neurology

## 2018-12-12 DIAGNOSIS — F0280 Dementia in other diseases classified elsewhere without behavioral disturbance: Secondary | ICD-10-CM | POA: Diagnosis not present

## 2018-12-12 DIAGNOSIS — G301 Alzheimer's disease with late onset: Secondary | ICD-10-CM | POA: Diagnosis not present

## 2018-12-12 NOTE — Telephone Encounter (Signed)
Message left with the After hour service on 12-12-18 @ 12:45 pm    Caller states that she needs something to challenge her brain she would like some ideas

## 2018-12-12 NOTE — Assessment & Plan Note (Signed)
She is hyperfocused on which questions she missed and why she has had change in the last 2 years in those numbers. I did attempt to explain that this can happen especially in those with strong education and english background the testing can underestimate dementia progression and we can see the numbers dramatically change. She does not remember the visit with neurology in May and asked her to talk to her children as they were also present for the visit and can ask them if this was true. Also explained to her that there are other signs of her memory changes and even if she were to redo the memory test and get a better score I would not change my mind about the driving restrictions. She is not to drive and she is aware.

## 2018-12-12 NOTE — Progress Notes (Signed)
Virtual Visit via Audio Note  I connected with Rachel Ferguson on 12/12/18 at 10:20 AM EDT by a audio-only enabled telemedicine application and verified that I am speaking with the correct person using two identifiers.  The patient and the provider were at separate locations throughout the entire encounter.   I discussed the limitations of evaluation and management by telemedicine and the availability of in person appointments. The patient expressed understanding and agreed to proceed.  History of Present Illness: The patient is a 85 y.o. female with visit for concerns about DMV form. She had wanted this filled out so that she could continue driving however we did not give this permission. She has had several visits recently for severe change to her mental status and overwhelming confusion. She is not seem safe to be driving. She does not agree with diagnosis of dementia but has behaviors that would strongly suggest this as well as prior testing which also indicated loss of executive function. She is living with her son currently and is not managing her own finances currently. The patient did have a telephone call for this same reason yesterday but she did not recall the reason for the visit the other day and then remembered later in the day and requested another visit to talk about this. She is very fixated on finding out what questions she missed and fixation on certain medications she was taking years ago when initial testing was done and questioning the validity of these results.   Observations/Objective: Voice strong, awake, no dyspnea or coughing during visit  Assessment and Plan: See problem oriented charting  Follow Up Instructions: she is aware that she is not to be driving and that her retaking the memory test will not alter this  Visit time 22 minutes: that time was spent in face to face counseling and coordination of care with the patient: counseled about as above  I discussed the  assessment and treatment plan with the patient. The patient was provided an opportunity to ask questions and all were answered. The patient agreed with the plan and demonstrated an understanding of the instructions.   The patient was advised to call back or seek an in-person evaluation if the symptoms worsen or if the condition fails to improve as anticipated.  Hoyt Koch, MD

## 2018-12-13 ENCOUNTER — Telehealth: Payer: Self-pay

## 2018-12-13 NOTE — Telephone Encounter (Signed)
Called patient back and patient states that she was told that [employer] nurse when she comes to see her once a year that she sends her findings to Dr. Sharlet Salina. Patient was just wondering if we had the findings. I informed patient that I did not see any findings in her chart that have been scanned in from [employer] or wellness or well check. Unsure if there is some other place I could be looking patient stated understanding was confused but hung up because someone else was calling her

## 2018-12-13 NOTE — Telephone Encounter (Signed)
Copied from [place] [id number]. Topic: General - Other >> Dec 12, 2018 11:24 AM Keene Breath wrote: Reason for CRM: Patient called to request the nurse call her regarding a conversation with her insurance about her mental health.  Please advise and call patient back at [phone]

## 2018-12-13 NOTE — Telephone Encounter (Signed)
Do you have any particular resources or exercises for this pt?

## 2018-12-19 ENCOUNTER — Ambulatory Visit: Payer: Medicare Other | Admitting: Family Medicine

## 2018-12-20 ENCOUNTER — Ambulatory Visit (INDEPENDENT_AMBULATORY_CARE_PROVIDER_SITE_OTHER): Payer: Medicare Other | Admitting: Internal Medicine

## 2018-12-20 ENCOUNTER — Other Ambulatory Visit: Payer: Self-pay

## 2018-12-20 ENCOUNTER — Encounter: Payer: Self-pay | Admitting: Internal Medicine

## 2018-12-20 VITALS — BP 118/80 | HR 54 | Temp 97.9°F | Ht 64.5 in | Wt 126.0 lb

## 2018-12-20 DIAGNOSIS — G301 Alzheimer's disease with late onset: Secondary | ICD-10-CM | POA: Diagnosis not present

## 2018-12-20 DIAGNOSIS — F0280 Dementia in other diseases classified elsewhere without behavioral disturbance: Secondary | ICD-10-CM | POA: Diagnosis not present

## 2018-12-20 NOTE — Progress Notes (Signed)
   Subjective:   Patient ID: Rachel Ferguson, female    DOB: [birth year], 85 y.o.   MRN: [id number]  HPI The patient is an 85 YO female coming in to discuss driving. She has had several visits recently due to [facility] DMV form which did not feel she was medically appropriate to drive. She does have alzheimer's which she is in denial about. She has had severe decline in the last 6 months of her mental health. Recent MOCA 10/22. She is upset about not being able to drive and states she wants some other test to see if she can drive. She states multiple times that she did not know the importance of the questions she was being asked before and would have done better if she had known.   Review of Systems  Constitutional: Negative.   HENT: Negative.   Eyes: Negative.   Respiratory: Negative for cough, chest tightness and shortness of breath.   Cardiovascular: Negative for chest pain, palpitations and leg swelling.  Gastrointestinal: Negative for abdominal distention, abdominal pain, constipation, diarrhea, nausea and vomiting.  Musculoskeletal: Negative.   Skin: Negative.   Neurological: Negative.   Psychiatric/Behavioral: Negative.     Objective:  Physical Exam Constitutional:      Appearance: She is well-developed.  HENT:     Head: Normocephalic and atraumatic.  Neck:     Musculoskeletal: Normal range of motion.  Cardiovascular:     Rate and Rhythm: Normal rate and regular rhythm.  Pulmonary:     Effort: Pulmonary effort is normal. No respiratory distress.     Breath sounds: Normal breath sounds. No wheezing or rales.  Abdominal:     General: Bowel sounds are normal. There is no distension.     Palpations: Abdomen is soft.     Tenderness: There is no abdominal tenderness. There is no rebound.  Skin:    General: Skin is warm and dry.  Neurological:     Mental Status: She is alert and oriented to person, place, and time.     Coordination: Coordination normal.     Vitals:   12/20/18 1027   BP: 118/80  Pulse: (!) 54  Temp: 97.9 F (36.6 C)  TempSrc: Oral  SpO2: 99%  Weight: 126 lb (57.2 kg)  Height: 5' 4.5" (1.638 m)    Assessment & Plan:  Visit time 40 minutes: greater than 50% of that time was spent in face to face counseling and coordination of care with the patient: counseled about her inability to legally drive as well as the reasons why memory can change over time and how driving requires a lot of complex memory skills which I do not have confidence she can do safely and reliably.

## 2018-12-20 NOTE — Assessment & Plan Note (Signed)
Explained to her and daughter in law that I do not think she can legally drive at this time. She is insistent that this was just due to sleep and denies when we talk about the fact that her memory has been in decline since 2018. She insists on getting another test done. I do not think an MMSE would be sufficient given prior intelligence and language background this could be artificially high. Ordered neuropsych testing and will review once done however informed patient that I will likely not change decision even if testing is better.

## 2018-12-26 NOTE — Telephone Encounter (Signed)
Nothing more in addition to what we had discussed, however she is forgetful and has no insight into her condition.

## 2019-01-10 ENCOUNTER — Telehealth: Payer: Self-pay | Admitting: Internal Medicine

## 2019-01-10 ENCOUNTER — Telehealth: Payer: Self-pay | Admitting: Neurology

## 2019-01-10 NOTE — Telephone Encounter (Signed)
Pt states that she will jump through hoops to be able to drive again she is doing great and really needs to go and help her sister please just give her a chance she said  Please call

## 2019-01-10 NOTE — Telephone Encounter (Signed)
Called patient and informed of MD response. Patient wanted to relay that it should be up to her whether she takes care of her family or not. I explained to patient that yes it is ultimately up to her, but since she was asking for Dr. Nathanial Millman opinion and that was what her opinion was. That with her age it is dangerous to be around let alone being the care giver to someone with COVID. Patient stated understanding but addiment that it is up to her. Patient also was informed she could not legally drive and was told it had nothing to do with driving record that with her decline in memory that makes it dangerous to be driving. Patient stated that she would like to be seen and talked to about her memory and I explained that she needed to get in touch with neurology as that was the plan at her last visit with [place], but that Dr. Sharlet Salina is not going to change her mind on being able to drive. Patient then went on to tell me she had started taking Nitric oxide blood flow 7 daily and wanted Dr. Sharlet Salina to know. Stated I would pass the information along. Patient did not have any other questions or concerns that needed to be answered and stated will get a hold of neurology.

## 2019-01-10 NOTE — Telephone Encounter (Signed)
Agree with PCP. If she would like to keep contending her diagnosis, recommend getting a second opinion from a different neurologist as I have discussed this with her several times. Patient has no insight into her condition. Thanks

## 2019-01-10 NOTE — Telephone Encounter (Signed)
Spoke to patient and informed her that Dr. Delice Lesch stated also that patient is NOT to drive. I reinforced what her PCP Dr. Nathanial Millman office had told her today about her legally not being allowed to drive. Patient was very frustrated and did not understand why she could not drive but did comprehend I was telling her the answer was no driving. Did inform patient she does have the option of a second opinion if she would like to set up an appt with another neurologist.

## 2019-01-10 NOTE — Telephone Encounter (Signed)
She is not legal to drive. Her driving privileges have been revoked by the state so she should not drive under any circumstances. I would also not advise to go care for someone with covid-19 unless there is no one else who can do so.

## 2019-01-10 NOTE — Telephone Encounter (Signed)
Copied from [place] [id number]. Topic: General - Inquiry >> Jan 10, 2019 11:06 AM Virl Axe D wrote: Reason for CRM: Pt stated her sisters in [place], [place] have been diagnosed with Covid-19 and she is planning to drive their to take care of them. She would like to know if Dr. Sharlet Salina thinks she should not go and is requesting a callback as soon as possible. She stated she is planning to leave today or tomorrow. Pt mentioned having her driving privileges revoked at one point. Please advise.

## 2019-01-10 NOTE — Telephone Encounter (Signed)
Pt want Dr Delice Lesch to say that she can drive she is very healthy and runs everyday. She is doing good now. She states that she needs to drive to be with her three sisters that have came down with the [place]. Please call

## 2019-01-10 NOTE — Telephone Encounter (Signed)
Patient left VM about having problems sleeping and was told not to drive. But she wanted to let Dr. Delice Lesch know that she is better and that all three of her sister's have the [place] and she is driving to [place], [place] to be with them and take care of them. Just FYI. Thanks!

## 2019-01-10 NOTE — Telephone Encounter (Signed)
Noted patient has called this office three times today wanting permission to be able to drive to care for sisters with Covid out of town. She has also contacted PCP -  Dr. Nathanial Millman notes today state patient is not legally allowed to drive as her driving privileges have been revoked by the state and she should not drive under any circumstance.   Is there anything else you would like me to tell patient other than to reiterate what was told to her by her PCP office today?

## 2019-01-11 ENCOUNTER — Telehealth: Payer: Self-pay | Admitting: Neurology

## 2019-01-11 NOTE — Telephone Encounter (Signed)
Spoke to patient's daughter this am then patient called back. She again was asking about retesting and I shared with her the same information I told daughter.  Dr. Delice Lesch will not be testing patient again as she has already been retested. Informed patient that as it is the MD recommendation that patient not drive and if the she doesn't agree then it is best for her to obtain a second opinion from a different neurologist, local resources provided. Patient wrote down the local resources and thanked me.

## 2019-01-11 NOTE — Telephone Encounter (Signed)
Pt calling, again wanting to know why Dr. Sharlet Salina isn't going to allow her to drive.  States that her memory is better than it was before and wants to know if Dr. Sharlet Salina remembers when she allowed someone to come in the room and she scored 19 out of 20 on a test to show that she was capable of memory responses.

## 2019-01-11 NOTE — Telephone Encounter (Signed)
Patient was told why she is unable to drive. Her memory is declining and due to the fact that she repeatedly calls to ask [place] why she cannot drive is proof that her memory is declining. Patient was told she should contact her neurologist. If she would further like to discuss can make a visit.

## 2019-01-11 NOTE — Telephone Encounter (Addendum)
Returned patient's call from this am about wanting to be retested as she wants to be able to drive. Daughter Benjamine Mola answered (on [place]) and I shared with her I spoke with her mom yesterday afternoon (patient called office three times yesterday). I reiterated that Dr. Delice Lesch will not be testing patient again as she has already been retested. See yesterday's note regarding instructions that patient is legally not allowed to drive. Informed daughter that as it is the MD recommendation that patient not drive and if the patient doesn't agree then it is best for her to obtain a second opinion from a different neurologist, local resources provided. Daughter verbalized understanding and states will share with her mom.

## 2019-01-11 NOTE — Telephone Encounter (Signed)
Patient was told by Dr that she did not do well on a previous test. States she was under the influence of hydroxyzine and it makes her dizzy. States when she took the second test under someone else she got 19 out of 20 questions correct. She would like to speak with someone about this. Thanks!

## 2019-01-11 NOTE — Telephone Encounter (Signed)
She unfortunately has no insight into her condition. I have discussed this with her repeatedly in the past and have repeated memory testing. We will not do repeat memory testing. At this point, we have given her advice and recommendations, and if she does not accept it, it would be best to get a second opinion. Thanks

## 2019-01-15 NOTE — Telephone Encounter (Addendum)
Patient called and wants to know why she can't drive. See prior encounters of several incoming/outgoing phone calls with patient about this from last week. Stated she is a good driver and that her daughter doesn't want her to drive because her daughter was "neglected in her childhood because I had to pay more attention to her brother who was diagnosed with Downs Syndrome" . She said she and her daughter "aren't getting along well" now.   Patient states she is not sleeping well because she doesn't understand why she can't drive. Reinforced with patient the recommendations of her PCP and Dr. Delice Lesch that she is NOT to drive. See previous notes - driving has been revoked from the state therefore patient is legally not allowed to drive.   Reminded patient that her option was to contact another neurologist office (names/numbers given last week) if she wanted a second opinion regarding retesting (has been retested at this office already). She states she called those offices and they "won't see her unless she has a recommendation from Dr. Delice Lesch". Patient said she is "furious" and doesn't understand why she can't drive or be retested. Reinforced information again and she wants Dr. Delice Lesch to be aware of above information received when she called other offices (per patient). Told patient I would make MD aware of above and call her back today by the end of the day.

## 2019-01-15 NOTE — Telephone Encounter (Signed)
Pls let her know that [facility] has a Scientist, physiological, if she is okay with going there, we will send in the referral for second opinion. Their numbers are [phone] and [phone], can you pls call the clinic and ask how to send a referral, do we just fax notes? Thanks

## 2019-01-15 NOTE — Telephone Encounter (Addendum)
Spoke to [facility] and to refer to their Memory Assessment Clinic a copy of the most recent office notes, demographic sheet, and any pertinent imaging should be sent along with a cover sheet (addressed to [place] at fax [phone]).  Left message with daughter (on [place]) that I was calling to offer to her mom if she wanted [place] to refer her to the [facility] memory assessment clinic.

## 2019-01-16 NOTE — Telephone Encounter (Addendum)
Called patient and informed her that her information was faxed to [facility]. Addressed to [place] at [employer] [phone]. Office number [phone] to clinic. Successful fax notice received.  Called clinic and they stated nothing needs to be entered (referral wise) in computer - faxed last office note 10/27/18 and demographics. They are in [place].

## 2019-01-16 NOTE — Telephone Encounter (Signed)
Patient called again this am and was "very angry and upset" about why she can't be retested for her memory. She repeated much of what she has told me over her many phone calls this past week that has previously been shared with Dr. Delice Lesch.   I did offer to patient that we can sent her information to [facility]. She said that was fine but she is very upset that she is only getting two hours of sleep worrying about this. She was adamant that she wants to talk to Dr. Delice Lesch directly today about her last testing here as she doesn't believe it was accurate due to medications she was on at the time. Her number is [phone].  Fax # obtained to sent to Crawford Givens (new patient referrals at [facility]) [phone]. Clinic number [phone]. Will gather info and send today.

## 2019-01-16 NOTE — Telephone Encounter (Signed)
This conversation has been had repeatedly with the patient since 2019. I see that her PCP Dr. Sharlet Salina has already ordered Neuropsychological testing, this is the repeat memory testing that she is asking for.

## 2019-01-17 ENCOUNTER — Telehealth: Payer: Self-pay | Admitting: Internal Medicine

## 2019-01-17 MED ORDER — LORAZEPAM 0.5 MG PO TABS: 0.5000 mg | ORAL_TABLET | Freq: Two times a day (BID) | ORAL | 3 refills | Status: DC | PRN

## 2019-01-17 NOTE — Telephone Encounter (Signed)
Control database checked last refill: 10/11/2018  90 tabs LOV: 12/20/2018 NOV: none

## 2019-01-17 NOTE — Telephone Encounter (Signed)
She should have refills still left but I did send in a new rx.

## 2019-01-17 NOTE — Telephone Encounter (Signed)
noted 

## 2019-01-17 NOTE — Telephone Encounter (Signed)
Medication Refill - Medication:   LORazepam (ATIVAN) 0.5 MG tablet   Pt's daughter Benjamine Mola) states pt is extremely agitated and needs a refill of this medication as soon as possible, please advise.    Preferred Pharmacy:  [facility] [address] [place] [address] AT [place] [phone] (Phone) [phone] (Fax)

## 2019-02-16 ENCOUNTER — Encounter: Payer: Self-pay | Admitting: Internal Medicine

## 2019-02-16 ENCOUNTER — Telehealth: Payer: Self-pay | Admitting: Internal Medicine

## 2019-02-16 ENCOUNTER — Ambulatory Visit (INDEPENDENT_AMBULATORY_CARE_PROVIDER_SITE_OTHER): Payer: Medicare Other | Admitting: Internal Medicine

## 2019-02-16 DIAGNOSIS — F0280 Dementia in other diseases classified elsewhere without behavioral disturbance: Secondary | ICD-10-CM

## 2019-02-16 DIAGNOSIS — G301 Alzheimer's disease with late onset: Secondary | ICD-10-CM | POA: Diagnosis not present

## 2019-02-16 NOTE — Telephone Encounter (Signed)
Pt called to ask why her driving privileges have been taken away from her.  Pt states seh could not sleep last night thinking about this. Pt would like Dr Sharlet Salina to help her understand this. If the dr will explain to her please.

## 2019-02-16 NOTE — Telephone Encounter (Signed)
Patient can make a telephone or virtual appointment to discuss this afternoon

## 2019-02-16 NOTE — Progress Notes (Signed)
Virtual Visit via Audio Note  I connected with Rachel Ferguson on 02/16/19 at  1:00 PM EDT by an audio-only enabled telemedicine application and verified that I am speaking with the correct person using two identifiers.  The patient and the provider were at separate locations throughout the entire encounter.   I discussed the limitations of evaluation and management by telemedicine and the availability of in person appointments. The patient expressed understanding and agreed to proceed.  History of Present Illness: The patient is a 85 y.o. female with visit for concerns about her legal recommendation of not to drive. She has been assess and diagnosed with alzheimer's based on neuropsych testing. She disputes this. She had several episodes back in March and April with severe memory changes and not lucid. She is now back to her more normal state. Last MOCA done by neurology with severe impairment. She has had some changes in behavior over the last several years. She is upset about not being able to drive. She wants to be retested as she feels it is a mistake. She feels that her memory is fine and is not sure why people say she has memory problems.   Observations/Objective: Voice strong, no coughing or dyspnea, awake, has forgotten many past conversations about same topic  Assessment and Plan: See problem oriented charting  Follow Up Instructions: advised to keep upcoming visit with [facility], advised not to drive as legally her driving privileges are revoked  Visit time 23 minutes: greater than 50% of that time was spent in non-face to face counseling and coordination of care with the patient: counseled about as above  I discussed the assessment and treatment plan with the patient. The patient was provided an opportunity to ask questions and all were answered. The patient agreed with the plan and demonstrated an understanding of the instructions.   The patient was advised to call back or seek an  in-person evaluation if the symptoms worsen or if the condition fails to improve as anticipated.  Hoyt Koch, MD

## 2019-02-16 NOTE — Telephone Encounter (Signed)
Pt scheduled  

## 2019-02-16 NOTE — Assessment & Plan Note (Signed)
Reminded yet again why she legally cannot drive. Advised her to keep visit for January with [facility] if she desires reassessment.

## 2019-02-28 ENCOUNTER — Telehealth: Payer: Self-pay | Admitting: Internal Medicine

## 2019-03-05 ENCOUNTER — Telehealth: Payer: Self-pay | Admitting: Internal Medicine

## 2019-03-05 MED ORDER — LEVOTHYROXINE SODIUM 75 MCG PO TABS: 75.0000 ug | ORAL_TABLET | Freq: Every day | ORAL | 0 refills | Status: DC

## 2019-03-05 NOTE — Telephone Encounter (Signed)
Sent!

## 2019-03-05 NOTE — Telephone Encounter (Signed)
Pt is out of town and forgot her levothyroxine. Please send new rx to [facility] [address] phone number [phone]

## 2019-03-21 ENCOUNTER — Other Ambulatory Visit: Payer: Self-pay

## 2019-03-21 ENCOUNTER — Ambulatory Visit (INDEPENDENT_AMBULATORY_CARE_PROVIDER_SITE_OTHER)
Admission: RE | Admit: 2019-03-21 | Discharge: 2019-03-21 | Disposition: A | Discharge status: Home / Self Care | Payer: Medicare Other | Source: Ambulatory Visit | Attending: Internal Medicine | Admitting: Internal Medicine

## 2019-03-21 ENCOUNTER — Ambulatory Visit (INDEPENDENT_AMBULATORY_CARE_PROVIDER_SITE_OTHER): Payer: Medicare Other | Admitting: Internal Medicine

## 2019-03-21 ENCOUNTER — Encounter: Payer: Self-pay | Admitting: Internal Medicine

## 2019-03-21 VITALS — BP 150/88 | HR 69 | Temp 97.8°F | Ht 64.5 in | Wt 128.0 lb

## 2019-03-21 DIAGNOSIS — Z23 Encounter for immunization: Secondary | ICD-10-CM

## 2019-03-21 DIAGNOSIS — M25551 Pain in right hip: Secondary | ICD-10-CM | POA: Diagnosis not present

## 2019-03-21 NOTE — Progress Notes (Signed)
   Subjective:   Patient ID: Rachel Ferguson, female    DOB: 12-18-1930, 85 y.o.   MRN: [id number]  HPI The patient is an 85 YO female coming in for right hip pain for about 2 days now. No injury or overuse to trigger. She is getting 10K steps per day but spreading them out throughout the day. Denies ever having trouble with this in the past. Denies taking anything for this. Is hurting 1/10. Has used some creme which did not help. Pain is on the side of the hip and not in the groin. Prior lumbar x-ray with some arthritis changes but she denies low back pain.   Review of Systems  Constitutional: Negative.   Respiratory: Negative for cough, chest tightness and shortness of breath.   Cardiovascular: Negative for chest pain, palpitations and leg swelling.  Gastrointestinal: Negative for abdominal distention, abdominal pain, constipation, diarrhea, nausea and vomiting.  Musculoskeletal: Positive for arthralgias and myalgias.  Skin: Negative.   Neurological: Negative.     Objective:  Physical Exam Constitutional:      Appearance: She is well-developed.  HENT:     Head: Normocephalic and atraumatic.  Neck:     Musculoskeletal: Normal range of motion.  Cardiovascular:     Rate and Rhythm: Normal rate and regular rhythm.  Pulmonary:     Effort: Pulmonary effort is normal. No respiratory distress.     Breath sounds: Normal breath sounds. No wheezing or rales.  Abdominal:     General: Bowel sounds are normal. There is no distension.     Palpations: Abdomen is soft.     Tenderness: There is no abdominal tenderness. There is no rebound.  Musculoskeletal:        General: Tenderness present.     Comments: Pain on the lateral aspect of the right hip.   Skin:    General: Skin is warm and dry.  Neurological:     Mental Status: She is alert.     Coordination: Coordination normal.     Comments: Some memory changes including past visit about 1 year ago for same problem which she does not recall even  after reminder.      Vitals:   03/21/19 1059  BP: (!) 150/88  Pulse: 69  Temp: 97.8 F (36.6 C)  TempSrc: Oral  SpO2: 97%  Weight: 128 lb (58.1 kg)  Height: 5' 4.5" (1.638 m)    Assessment & Plan:  Flu shot given at visit

## 2019-03-21 NOTE — Patient Instructions (Signed)
We are checking the x-ray of the hip but think that this is bursitis.    Hip Bursitis Rehab Ask your health care provider which exercises are safe for you. Do exercises exactly as told by your health care provider and adjust them as directed. It is normal to feel mild stretching, pulling, tightness, or discomfort as you do these exercises. Stop right away if you feel sudden pain or your pain gets worse. Do not begin these exercises until told by your health care provider. Stretching exercise This exercise warms up your muscles and joints and improves the movement and flexibility of your hip. This exercise also helps to relieve pain and stiffness. Iliotibial band stretch An iliotibial band is a strong band of muscle tissue that runs from the outer side of your hip to the outer side of your thigh and knee. 1. Lie on your side with your left / right leg in the top position. 2. Bend your left / right knee and grab your ankle. Stretch out your bottom arm to help you balance. 3. Slowly bring your knee back so your thigh is behind your body. 4. Slowly lower your knee toward the floor until you feel a gentle stretch on the outside of your left / right thigh. If you do not feel a stretch and your knee will not fall farther, place the heel of your other foot on top of your knee and pull your knee down toward the floor with your foot. 5. Hold this position for __________ seconds. 6. Slowly return to the starting position. Repeat __________ times. Complete this exercise __________ times a day. Strengthening exercises These exercises build strength and endurance in your hip and pelvis. Endurance is the ability to use your muscles for a long time, even after they get tired. Bridge This exercise strengthens the muscles that move your thigh backward (hip extensors). 1. Lie on your back on a firm surface with your knees bent and your feet flat on the floor. 2. Tighten your buttocks muscles and lift your buttocks  off the floor until your trunk is level with your thighs. ? Do not arch your back. ? You should feel the muscles working in your buttocks and the back of your thighs. If you do not feel these muscles, slide your feet 1-2 inches (2.5-5 cm) farther away from your buttocks. ? If this exercise is too easy, try doing it with your arms crossed over your chest. 3. Hold this position for __________ seconds. 4. Slowly lower your hips to the starting position. 5. Let your muscles relax completely after each repetition. Repeat __________ times. Complete this exercise __________ times a day. Squats This exercise strengthens the muscles in front of your thigh and knee (quadriceps). 1. Stand in front of a table, with your feet and knees pointing straight ahead. You may rest your hands on the table for balance but not for support. 2. Slowly bend your knees and lower your hips like you are going to sit in a chair. ? Keep your weight over your heels, not over your toes. ? Keep your lower legs upright so they are parallel with the table legs. ? Do not let your hips go lower than your knees. ? Do not bend lower than told by your health care provider. ? If your hip pain increases, do not bend as low. 3. Hold the squat position for __________ seconds. 4. Slowly push with your legs to return to standing. Do not use your hands to pull  yourself to standing. Repeat __________ times. Complete this exercise __________ times a day. Hip hike 1. Stand sideways on a bottom step. Stand on your left / right leg with your other foot unsupported next to the step. You can hold on to the railing or wall for balance if needed. 2. Keep your knees straight and your torso square. Then lift your left / right hip up toward the ceiling. 3. Hold this position for __________ seconds. 4. Slowly let your left / right hip lower toward the floor, past the starting position. Your foot should get closer to the floor. Do not lean or bend your  knees. Repeat __________ times. Complete this exercise __________ times a day. Single leg stand 1. Without shoes, stand near a railing or in a doorway. You may hold on to the railing or door frame as needed for balance. 2. Squeeze your left / right buttock muscles, then lift up your other foot. ? Do not let your left / right hip push out to the side. ? It is helpful to stand in front of a mirror for this exercise so you can watch your hip. 3. Hold this position for __________ seconds. Repeat __________ times. Complete this exercise __________ times a day. This information is not intended to replace advice given to you by your health care provider. Make sure you discuss any questions you have with your health care provider. Document Released: 07/08/2004 Document Revised: 09/25/2018 Document Reviewed: 09/25/2018 Elsevier Patient Education  2020 [employer].

## 2019-03-22 NOTE — Assessment & Plan Note (Signed)
Suspect trochanteric bursitis. Getting right hip x-ray to rule out arthritis. Can use tylenol for pain if needed and given exercises.

## 2019-03-28 ENCOUNTER — Other Ambulatory Visit: Payer: Self-pay | Admitting: Internal Medicine

## 2019-05-08 ENCOUNTER — Ambulatory Visit: Payer: Self-pay | Admitting: *Deleted

## 2019-05-08 NOTE — Telephone Encounter (Signed)
Patient is unable to stand without holding on.Lost patient audio on call- attempted to get her back. Left message to call back on VM of cell number. Call alternate number and it is her daughter- she is going to call her back and check on her.

## 2019-05-08 NOTE — Telephone Encounter (Signed)
fyi

## 2019-05-08 NOTE — Telephone Encounter (Signed)
Noted thanks °

## 2019-05-08 NOTE — Telephone Encounter (Signed)
  Pt called c/o dizziness. She stated she had walked a long walk this morning, because she is a walker.  She came back and had breakfast. And then she became dizzy.  She stated she had a lot of water when she took her medications this morning. But does not drink a lot of water, because she was told by her pcp that she did not need to drink a lot of water. She has been evaluated for dizziness in the ED and had to have IV fluids, per her son who is with her.  Did not want an appointment. Advised to drink more water to see if it helps. And to call back if not.  She voiced understanding. Routing to the [facility] at [facility] for review and any recommendation.  Reason for Disposition . Dizziness caused by poor fluid intake  Answer Assessment - Initial Assessment Questions 1. DESCRIPTION: "Describe your dizziness."     lightheaded 2. LIGHTHEADED: "Do you feel lightheaded?" (e.g., somewhat faint, woozy, weak upon standing)     yes 3. VERTIGO: "Do you feel like either you or the room is spinning or tilting?" (i.e. vertigo)     no 4. SEVERITY: "How bad is it?"  "Do you feel like you are going to faint?" "Can you stand and walk?"   - MILD - walking normally   - MODERATE - interferes with normal activities (e.g., work, school)    - SEVERE - unable to stand, requires support to walk, feels like passing out now.      moderate 5. ONSET:  "When did the dizziness begin?"     This morning after walking 6. AGGRAVATING FACTORS: "Does anything make it worse?" (e.g., standing, change in head position)     no 7. HEART RATE: "Can you tell me your heart rate?" "How many beats in 15 seconds?"  (Note: not all patients can do this)       no 8. CAUSE: "What do you think is causing the dizziness?"     Not sure  9. RECURRENT SYMPTOM: "Have you had dizziness before?" If so, ask: "When was the last time?" "What happened that time?"     Yes and went to the ED for IV fluids 10. OTHER SYMPTOMS: "Do you have any other  symptoms?" (e.g., fever, chest pain, vomiting, diarrhea, bleeding)       Diarrhea a couple of days ago 11. PREGNANCY: "Is there any chance you are pregnant?" "When was your last menstrual period?"       n/a  Protocols used: DIZZINESS [facility]

## 2019-05-15 ENCOUNTER — Telehealth: Payer: Self-pay | Admitting: Neurology

## 2019-05-15 ENCOUNTER — Telehealth: Payer: Self-pay

## 2019-05-15 NOTE — Telephone Encounter (Signed)
This has been discussed multiple times in the past, she cannot drive. She has not insight into her condition. If she would like a second opinion, information has been given to her to contact the [facility] (pls see prior notes).

## 2019-05-15 NOTE — Telephone Encounter (Signed)
Patient left msg about wanting to see if she cn get her license back. She is no longer on back meds. Thanks!

## 2019-05-15 NOTE — Telephone Encounter (Signed)
Copied from [place] [id number]. Topic: General - Inquiry >> May 15, 2019 10:46 AM Rachel Ferguson wrote: Reason for CRM:   Pt states that DMV has told her that a letter was sent to Dr. Sharlet Salina back in June to get pt's driving privileges reinstated, but that it was never sent back to the [facility] from the PCP office. Pt wants to know where this stands. Pt can be reached at [phone]

## 2019-05-15 NOTE — Telephone Encounter (Signed)
Spoke with patient and advised to contact [facility] for further evaluation for driving.

## 2019-05-15 NOTE — Telephone Encounter (Signed)
Patient informed that Dr. Sharlet Salina would not be reinstating her driving privileges as she does not think it is safe for patient to be driving on the road. Patient informed that she was told this before by Dr. Sharlet Salina, patient still upset about the fact that she cannot get this reinstated and will drive if needs to. will route to PCP as fyi.

## 2019-05-25 ENCOUNTER — Telehealth: Payer: Self-pay

## 2019-05-25 NOTE — Telephone Encounter (Signed)
Copied from [place] [id number]. Topic: General - Other >> May 25, 2019  9:52 AM Leward Quan A wrote: Reason for CRM: Patient called say that she need to discuss her Bursitis with Dr Sharlet Salina say that she may need a referral to see two separate doctors. Say that the Bursitis is in her right hip. Asking for a call back Ph# [phone]

## 2019-05-25 NOTE — Telephone Encounter (Signed)
Can schedule visit to discuss potential bursitis.

## 2019-05-25 NOTE — Telephone Encounter (Signed)
Patient scheduled for 05/28/2019 at 2:00pm.

## 2019-05-25 NOTE — Telephone Encounter (Signed)
Can you make patient a visit to discuss. Thank you

## 2019-05-28 ENCOUNTER — Other Ambulatory Visit: Payer: Self-pay

## 2019-05-28 ENCOUNTER — Encounter: Payer: Self-pay | Admitting: Internal Medicine

## 2019-05-28 ENCOUNTER — Ambulatory Visit (INDEPENDENT_AMBULATORY_CARE_PROVIDER_SITE_OTHER): Payer: Medicare Other | Admitting: Internal Medicine

## 2019-05-28 VITALS — BP 122/86 | HR 64 | Temp 98.2°F | Ht 64.5 in | Wt 130.0 lb

## 2019-05-28 DIAGNOSIS — M25551 Pain in right hip: Secondary | ICD-10-CM

## 2019-05-28 NOTE — Patient Instructions (Addendum)
Keep up with the exercises for the hip.  You can try unisom and zzquil to get to sleep on the nights you cannot sleep.

## 2019-05-28 NOTE — Progress Notes (Signed)
   Subjective:   Patient ID: Rachel Ferguson, female    DOB: 03/01/1931, 85 y.o.   MRN: [id number]  HPI The patient is an 85 YO female coming in for right hip pain. Seen back in October with likely bursitis. X-ray ruled out severe arthritis. She is using a zaaz machine at home which is working wonders for her. She walks in between and uses this 4 times a day. This is keeping her relatively pain free. Having some intermittent problems with sleeping. Using remeron which helps sometimes but others it takes her 2-3 hours to fall asleep.   Review of Systems  Constitutional: Negative.   HENT: Negative.   Eyes: Negative.   Respiratory: Negative for cough, chest tightness and shortness of breath.   Cardiovascular: Negative for chest pain, palpitations and leg swelling.  Gastrointestinal: Negative for abdominal distention, abdominal pain, constipation, diarrhea, nausea and vomiting.  Musculoskeletal: Negative.   Skin: Negative.   Neurological: Negative.   Psychiatric/Behavioral: Negative.     Objective:  Physical Exam Constitutional:      Appearance: She is well-developed.  HENT:     Head: Normocephalic and atraumatic.  Cardiovascular:     Rate and Rhythm: Normal rate and regular rhythm.  Pulmonary:     Effort: Pulmonary effort is normal. No respiratory distress.     Breath sounds: Normal breath sounds. No wheezing or rales.  Abdominal:     General: Bowel sounds are normal. There is no distension.     Palpations: Abdomen is soft.     Tenderness: There is no abdominal tenderness. There is no rebound.  Musculoskeletal:        General: No tenderness.     Cervical back: Normal range of motion.  Skin:    General: Skin is warm and dry.  Neurological:     Mental Status: She is alert and oriented to person, place, and time.     Coordination: Coordination normal.     Vitals:   05/28/19 1357  BP: 122/86  Pulse: 64  Temp: 98.2 F (36.8 C)  TempSrc: Oral  SpO2: 98%  Weight: 130 lb (59 kg)    Height: 5' 4.5" (1.638 m)    This visit occurred during the SARS-CoV-2 public health emergency.  Safety protocols were in place, including screening questions prior to the visit, additional usage of staff PPE, and extensive cleaning of exam room while observing appropriate contact time as indicated for disinfecting solutions.   Assessment & Plan:

## 2019-05-29 NOTE — Assessment & Plan Note (Signed)
Improved with exercise. No medications needed. Reinforced need to continue with stretching and exercise to help.

## 2019-05-30 ENCOUNTER — Telehealth: Payer: Self-pay | Admitting: *Deleted

## 2019-05-30 NOTE — Telephone Encounter (Signed)
We just had a visit yesterday and she told me then that her zaaz machine and walking were helping and she had no pain. I would recommend tylenol as safest for pain. To discuss something else we could have another visit if she is still in pain.

## 2019-05-30 NOTE — Telephone Encounter (Signed)
Copied from [place] [id number]. Topic: General - Other >> May 30, 2019  8:08 AM Keene Breath wrote: Reason for CRM: Patient called to inform the doctor that she is in a lot of pain from the bursitis.  She needs some advise as to what she should do.  Please call patient at [phone]

## 2019-05-30 NOTE — Telephone Encounter (Signed)
Pt informed of below. She states she was pain free at [place]. Her pain started late last night and she states her zaaz is not going to help. She is wanting something to permanently cure her bursitis so that she can continue walking regularly.  Also, she was very apologetic for saying one thing during the OV and then this happening so suddenly afterwards.   Ok to schedule a virtual visit?

## 2019-05-31 NOTE — Telephone Encounter (Signed)
I would recommend tylenol as first option for pain. Can also use ice. We can get her in with sports medicine if she wants.

## 2019-05-31 NOTE — Telephone Encounter (Signed)
Pt scheduled  

## 2019-05-31 NOTE — Telephone Encounter (Signed)
Can you schedule patient with sports medicine to help with bursitis pain

## 2019-06-04 ENCOUNTER — Ambulatory Visit (INDEPENDENT_AMBULATORY_CARE_PROVIDER_SITE_OTHER): Payer: Medicare Other | Admitting: Family Medicine

## 2019-06-04 ENCOUNTER — Encounter: Payer: Self-pay | Admitting: Family Medicine

## 2019-06-04 ENCOUNTER — Other Ambulatory Visit: Payer: Self-pay

## 2019-06-04 ENCOUNTER — Ambulatory Visit (INDEPENDENT_AMBULATORY_CARE_PROVIDER_SITE_OTHER): Payer: Medicare Other

## 2019-06-04 VITALS — BP 130/80 | HR 78 | Ht 64.5 in | Wt 134.4 lb

## 2019-06-04 DIAGNOSIS — M25551 Pain in right hip: Secondary | ICD-10-CM

## 2019-06-04 NOTE — Progress Notes (Signed)
I, Wendy Poet, LAT, ATC, am serving as scribe for Dr. Lynne Leader.  Rachel Ferguson is a 85 y.o. female who presents to [facility] today for f/u of insidious-onset R hip pain x 2.5 months.  Pt was last seen by Dr. Tamala Julian on 11/28/18 for low back pain and had an L-spine XR at that time.  She also had a R hip XR on 03/21/19 after seeing her PCP.  Pt states that she uses a Zaaz vibration plate and was given a HEP by her PCP consisting of bridges, ITB stretch, squats, hip hike and single leg stance at her last visit on 05/28/19.  Since her visit w/ her PCP, pt reports her pain is about the same.  She states that she has no pain at rest or w/ sitting but has pain w/ walking and with prolonged standing.  Pt rates her pain as a 5/10 at it's worst and describes it as throbbing in nature.  She denies any radiating pain into the R LE and denies any numbness/tingling into the R LE.  Aggravating factors include prolonged standing and walking.    ROS:  As above  Exam:  BP 130/80 (BP Location: Left Arm, Patient Position: Sitting, Cuff Size: Normal)   Pulse 78   Ht 5' 4.5" (1.638 m)   Wt 134 lb 6.4 oz (61 kg)   SpO2 97%   BMI 22.71 kg/m  Wt Readings from Last 5 Encounters:  06/04/19 134 lb 6.4 oz (61 kg)  05/28/19 130 lb (59 kg)  03/21/19 128 lb (58.1 kg)  12/20/18 126 lb (57.2 kg)  11/28/18 127 lb (57.6 kg)   General: Well Developed, well nourished, and in no acute distress.  Neuro/Psych: Alert and oriented x3, extra-ocular muscles intact, able to move all 4 extremities, sensation grossly intact. Skin: Warm and dry, no rashes noted.  Respiratory: Not using accessory muscles, speaking in full sentences, trachea midline.  Cardiovascular: Pulses palpable, no extremity edema. Abdomen: Does not appear distended. MSK: L-spine: Nontender to spinal midline normal lumbar motion. Right hip: Normal-appearing normal motion.   Mildly tender palpation greater trochanter and lateral iliac  crest. Hip adduction strength diminished 4/5.  External rotation internal rotation and adduction strength normal. Normal gait.  Left hip normal-appearing normal motion normal strength nontender.    Lab and Radiology Results DG Hip Unilat W OR W/O Pelvis 2-3 Views Right  Result Date: 03/21/2019 CLINICAL DATA:  Right hip pain for 2 days.  No known injury. EXAM: DG HIP (WITH OR WITHOUT PELVIS) 2-3V RIGHT COMPARISON:  None. FINDINGS: There is no evidence of hip fracture or dislocation. There is no evidence of arthropathy or other focal bone abnormality. IMPRESSION: Negative. Electronically Signed   By: Lorriane Shire M.D.   On: 03/21/2019 17:10   I, Lynne Leader, personally (independently) visualized and performed the interpretation of the images attached in this note.      Assessment and Plan: 85 y.o. female with lateral hip pain.  Hip abductor tendinopathy/trochanteric bursitis.  Patient may have a component of irritation with tensor fascia lata as well.  Plan to treat with home health physical therapy.  Patient does have mild dementia which increases her risk for confusion of home exercise teaching.  I think she will benefit from home health physical therapy services.  She needs to use home health physical therapy versus outpatient physical therapy as she does not drive and is somewhat homebound.  Recheck back with me in 1 month.  Return sooner if  needed.  Precautions reviewed.   PDMP not reviewed this encounter. Orders Placed This Encounter  Procedures  . [place] - LOWER Extremity - Limited - RIGHT    Order Specific Question:   Reason for Exam (SYMPTOM  OR DIAGNOSIS REQUIRED)    Answer:   R hip pain    Order Specific Question:   Preferred imaging location?    Answer:   Adult nurse Sports Medicine[facility]  . Ambulatory referral to [facility] PT    Referral Priority:   Routine    Referral Type:   Home Health Care    Referral Reason:   Specialty Services Required    Requested  Specialty:   Home Health Services    Number of Visits Requested:   1   No orders of the defined types were placed in this encounter.   Historical information moved to improve visibility of documentation.  Past Medical History:  Diagnosis Date  . Colon polyps   . GERD (gastroesophageal reflux disease)   . Migraines   . Osteopenia   . Thyroid disease    Past Surgical History:  Procedure Laterality Date  . KNEE SURGERY Right   . TONSILLECTOMY AND ADENOIDECTOMY     Social History   Tobacco Use  . Smoking status: Former Smoker    Packs/day: 0.25    Years: 5.00    Pack years: 1.25    Quit date: 05/05/1956    Years since quitting: 63.1  . Smokeless tobacco: Never Used  Substance Use Topics  . Alcohol use: Yes    Alcohol/week: 14.0 standard drinks    Types: 14 Standard drinks or equivalent per week    Comment: 1-2 glasses red wine nightly   family history includes Alzheimer's disease in her sister; Cancer in her mother and sister; Heart disease in her father and maternal grandmother; Melanoma in her sister; Tuberculosis in her paternal grandmother.  Medications: Current Outpatient Medications  Medication Sig Dispense Refill  . Apoaequorin (PREVAGEN EXTRA STRENGTH) 20 MG CAPS Take 20 mg by mouth daily. 30 capsule 6  . levothyroxine (SYNTHROID) 75 MCG tablet TAKE 1 TABLET (75 MCG TOTAL) BY MOUTH DAILY BEFORE BREAKFAST. 30 tablet 0  . mirtazapine (REMERON) 7.5 MG tablet Take 3 tablets every night 270 tablet 3  . Multiple Vitamin (MULTI-VITAMIN PO) Take 1 tablet by mouth.    . Multiple Vitamins-Minerals (PRESERVISION AREDS PO) Take 1 tablet by mouth daily.    Marland Kitchen omeprazole (PRILOSEC) 20 MG capsule TAKE 1 CAPSULE DAILY 90 capsule 3  . gabapentin (NEURONTIN) 100 MG capsule Take 2 capsules (200 mg total) by mouth at bedtime. (Patient not taking: Reported on 06/04/2019) 60 capsule 3  . LORazepam (ATIVAN) 0.5 MG tablet Take 1 tablet (0.5 mg total) by mouth 3 times/day as needed-between  meals & bedtime for anxiety. (Patient not taking: Reported on 05/28/2019) 90 tablet 3   No current facility-administered medications for this visit.   Allergies  Allergen Reactions  . Ciprofloxacin     Had knee pain after taking Cipro. Had knee pain after taking Cipro. Other reaction(s): Other (See Comments) Had knee pain after taking Cipro.      Discussed warning signs or symptoms. Please see discharge instructions. Patient expresses understanding.  The above documentation has been reviewed and is accurate and complete [name]

## 2019-06-04 NOTE — Patient Instructions (Addendum)
Thank you for coming in today. You should hear from home health physical therapy soon about an appointment.  Recheck with me in 4 weeks.  Return sooner if needed.

## 2019-06-13 ENCOUNTER — Telehealth: Payer: Self-pay

## 2019-06-13 NOTE — Telephone Encounter (Signed)
Referral sent to home health

## 2019-06-13 NOTE — Telephone Encounter (Signed)
Patient called wanting to know when she would hear from home health in regard to her PT. Informed patient that with the holliday's it probably got pushed back and that we would get in touch with them today and hopefully she would hear from them soon. Patient stated understanding

## 2019-06-21 ENCOUNTER — Telehealth: Payer: Self-pay | Admitting: Family Medicine

## 2019-06-21 NOTE — Telephone Encounter (Signed)
Called and detailed message on VM to let us know if she has or has not been contacted by home health PT.

## 2019-06-21 NOTE — Telephone Encounter (Signed)
Please contact pt to make sure she was able to get set up with home health PT   Clayburn Pert

## 2019-06-21 NOTE — Telephone Encounter (Signed)
Patient called back. She said that yes, there have been two different people out to see her. She mentioned that they have helped a lot and she really appreciates it!

## 2019-06-21 NOTE — Telephone Encounter (Signed)
-----   Message from Rodolph Bong, MD sent at 06/04/2019  2:24 PM EST ----- Regarding: Call pt to make sure she got home health PT set up. Pt with dementia. Call to make sure she set up home health PT

## 2019-07-05 ENCOUNTER — Ambulatory Visit: Payer: Medicare Other | Admitting: Family Medicine

## 2019-07-08 ENCOUNTER — Ambulatory Visit: Payer: Medicare Other

## 2019-07-13 ENCOUNTER — Telehealth: Payer: Self-pay | Admitting: Internal Medicine

## 2019-07-13 NOTE — Telephone Encounter (Signed)
    Patient calling to let Dr Okey Dupre know she has stopped  taking mirtazapine (REMERON) 7.5 MG tablet 2 nights ago. Patient states she is reading a book by Dr Selmer Dominion called "Keep Lambert Mody" and wants to discuss several topics with her including why she is not permitted to drive.

## 2019-07-16 ENCOUNTER — Telehealth: Payer: Self-pay

## 2019-07-16 NOTE — Telephone Encounter (Signed)
   Daughter returned call. States patient did have assessment with [facility].  Please return call to daughter

## 2019-07-16 NOTE — Telephone Encounter (Signed)
Called pt's daughter Lanora Manis, LVM to call back

## 2019-07-16 NOTE — Telephone Encounter (Signed)
Patient states that Dr. Denyse Amass needs to give verbal okay for PT to come out to her house. Patient gave me the name and number below.  Kathlene November at [facility] [phone]

## 2019-07-16 NOTE — Telephone Encounter (Signed)
We have discussed this many times. Has she had her second opinion visit with [facility] neurology for assessment yet?

## 2019-07-17 NOTE — Telephone Encounter (Signed)
Pt's daughter, Lanora Manis, called back. She stated that the patient is having a hard time accepting loosing some of her independence so she has been adamant in driving still. Lanora Manis stated that her and her siblings are aware and agree with the plan for her mother not to drive. Does not need an OV to discuss. She also mentioned that they have been looking into putting her into a memory care facility as soon as they feel comfortable with a better COVID situation, FYI.

## 2019-07-17 NOTE — Telephone Encounter (Signed)
Called Rachel Ferguson at [facility] who explained that they are not the ones providing Home Health PT to pt.  Rachel Ferguson states that he talked to Rachel Ferguson for approximately 30 min yesterday and explained that she had been getting her Home Health PT through [facility].  I call [facility] and am informed that they were doing home health PT w/ pt but that the pt was discharged from home health PT on 07/06/19 due to meeting her goals w/ PT.  I then call the pt back and explain all of this to her and she states that she has started having pain in her R hip/thigh again due to going for a long walk and having pain during this walk.  Pt states that she feels like PT helped her previously and would like to resume home health PT.  I inform her that I will speak w/ Dr. Denyse Amass about this to determine the next step.  After speaking w/ Dr. Denyse Amass, he would like to f/u w/ pt in the office before ordering more home health PT.

## 2019-07-17 NOTE — Telephone Encounter (Signed)
Looks like [facility] also thinks she has alzheimer's and advised that she not drive. I am happy to do virtual or in person to review their assessment of her in detail.

## 2019-07-17 NOTE — Telephone Encounter (Signed)
Called pt's daughter, Lanora Manis, LVM to discuss.

## 2019-07-17 NOTE — Telephone Encounter (Signed)
Called pt and her daughter Lanora Manis to inform them that pt will need a follow-up appt with Dr. Denyse Amass before he will send a new referral for home health PT.  LM for pt and spoke to her daughter who verbalized understanding.

## 2019-07-18 ENCOUNTER — Telehealth: Payer: Self-pay | Admitting: Neurology

## 2019-07-18 ENCOUNTER — Telehealth: Payer: Self-pay | Admitting: Emergency Medicine

## 2019-07-18 NOTE — Telephone Encounter (Signed)
Patient called and said she has "lost" her mirtazapine 7.5 MG medication. She'd like a one month supply sent into her local pharmacy.  She said she was reading a book, Building a Better Brain, and thought she didn't need all of her medications any more. However, she said she made a mistake and really does need this medication.  Patient is out of the medication completely and requests a call back once this has been sent in for her.   [facility] on [address].

## 2019-07-18 NOTE — Telephone Encounter (Signed)
error 

## 2019-07-18 NOTE — Telephone Encounter (Signed)
Pt trashed her meds because of the book building a better brain from a dr off of [employer] she has had spans of 3 nights with no sleep and she said it has hurt her brain. But she says that this book has changed her life and she lives by this book. She would like to re start her medication. Looks like its time for her to have an appointment

## 2019-07-19 MED ORDER — MIRTAZAPINE 7.5 MG PO TABS: ORAL_TABLET | ORAL | 11 refills | Status: DC

## 2019-07-19 NOTE — Telephone Encounter (Signed)
Pls let her know mirtazapine was sent to pharmacy. Thanks

## 2019-07-19 NOTE — Telephone Encounter (Signed)
Please call and make patient an appointment

## 2019-07-23 ENCOUNTER — Telehealth: Payer: Self-pay

## 2019-07-23 ENCOUNTER — Telehealth: Payer: Self-pay | Admitting: Family Medicine

## 2019-07-23 NOTE — Telephone Encounter (Signed)
Patient called stating that about a week ago, she was out walking and her hip started hurting very badly. She made it through her walk but the pain continued. She said that she has been icing it and putting Vitamin E on it which has helped for a small amount of time.  She wanted to know if there was anything else she can do. She "can't walk without icing it".  Please advise.

## 2019-07-23 NOTE — Telephone Encounter (Signed)
Inetta Fermo, RN with [facility] calling and states that she triaged the patient for dizziness. States that when she spoke with the patient, she stated that she had misses taking her levothyroxine and took 2 for today. States that that was 1 hour ago. Is not having any blurred vision. States that patient was having some confusion, but spoke with son who was there and he states that that is her base line. Triage states that she advised the patient to be seen by PCP. Please advise.

## 2019-07-23 NOTE — Telephone Encounter (Signed)
Called pt and scheduled a f/u visit for her for tomorrow 07/24/19 at 10 am

## 2019-07-23 NOTE — Telephone Encounter (Signed)
Received the documentation from [facility].   Patient called on  07/23/2019 1:41:40 PM and states "Caller states that her blood pressure is high, she has blurred vision, dizziness, and does not feel well all of a sudden. She states that she does not have a blood pressure cuff at home so she is not sure where it is at currently but she did recently stop taking Levothyroxine about a month ago. She states that she does not remember what her blood pressure was but states that both numbers were over 100. Caller states that she is going to send her son to [facility] closest to her to get her medication. Spoke with caller who stated that she is not having blurred vision at this time and reports that she is no longer dizzy and that dizziness started about 30 mins ago. Reports she is just feeling very tired. Added that she just took two levethyroxine 75mg  pills due to has been forgetting lately. Confirmed with son who is present that confusion is not new or worse than usual..   Patient advised by [facility] "SEE PCP WITHIN 24 HOURS: MEDICINE AS A CAUSE: * Your medicine may or may not be causing your dizziness. Your doctor can help you decide. DRINK FLUIDS: * Drink several glasses of fruit juice, other clear fluids or water. LIE DOWN AND REST: * Lie down with feet elevated for 1 hour. * This will improve circulation and increase blood flow to the brain. CALL BACK IF: * Passes out (faints) * You become worse. * IF OFFICE WILL BE CLOSED AND PCP SECOND-LEVEL TRIAGE REQUIRED: You may need to be seen within the next 24 hours. Your doctor (or NP/PA) will want to talk with you to decide what's best. I'll page the on-call provider now. NOTE: Since this isn't serious, hold the page between 10 pm and 7 am. Page the on-call provider in the morning."

## 2019-07-23 NOTE — Telephone Encounter (Signed)
Spoke with patient and her son, Jonny Ruiz. Patient is scheduled for 07/24/2019 at 9:20am with Ria Clock.

## 2019-07-24 ENCOUNTER — Encounter: Payer: Self-pay | Admitting: Family

## 2019-07-24 ENCOUNTER — Ambulatory Visit (INDEPENDENT_AMBULATORY_CARE_PROVIDER_SITE_OTHER): Payer: Medicare Other | Admitting: Family Medicine

## 2019-07-24 ENCOUNTER — Encounter: Payer: Self-pay | Admitting: Family Medicine

## 2019-07-24 ENCOUNTER — Other Ambulatory Visit: Payer: Self-pay

## 2019-07-24 ENCOUNTER — Encounter: Payer: Self-pay | Admitting: Neurology

## 2019-07-24 ENCOUNTER — Ambulatory Visit (INDEPENDENT_AMBULATORY_CARE_PROVIDER_SITE_OTHER): Payer: Medicare Other | Admitting: Family

## 2019-07-24 ENCOUNTER — Ambulatory Visit: Payer: Medicare Other | Admitting: Family

## 2019-07-24 VITALS — BP 132/80 | HR 57 | Temp 98.0°F | Ht 64.5 in | Wt 129.0 lb

## 2019-07-24 VITALS — BP 138/84 | HR 61 | Ht 64.5 in | Wt 129.2 lb

## 2019-07-24 DIAGNOSIS — G301 Alzheimer's disease with late onset: Secondary | ICD-10-CM

## 2019-07-24 DIAGNOSIS — R42 Dizziness and giddiness: Secondary | ICD-10-CM

## 2019-07-24 DIAGNOSIS — E039 Hypothyroidism, unspecified: Secondary | ICD-10-CM

## 2019-07-24 DIAGNOSIS — F0280 Dementia in other diseases classified elsewhere without behavioral disturbance: Secondary | ICD-10-CM | POA: Diagnosis not present

## 2019-07-24 DIAGNOSIS — M25551 Pain in right hip: Secondary | ICD-10-CM

## 2019-07-24 LAB — CBC WITH DIFFERENTIAL/PLATELET
Basophils Absolute: 0 10*3/uL (ref 0.0–0.1)
Basophils Relative: 0.4 % (ref 0.0–3.0)
Eosinophils Absolute: 0 10*3/uL (ref 0.0–0.7)
Eosinophils Relative: 0.4 % (ref 0.0–5.0)
HCT: 39 % (ref 36.0–46.0)
Hemoglobin: 13 g/dL (ref 12.0–15.0)
Lymphocytes Relative: 23 % (ref 12.0–46.0)
Lymphs Abs: 1.2 10*3/uL (ref 0.7–4.0)
MCHC: 33.3 g/dL (ref 30.0–36.0)
MCV: 96.3 fl (ref 78.0–100.0)
Monocytes Absolute: 0.5 10*3/uL (ref 0.1–1.0)
Monocytes Relative: 9.2 % (ref 3.0–12.0)
Neutro Abs: 3.5 10*3/uL (ref 1.4–7.7)
Neutrophils Relative %: 67 % (ref 43.0–77.0)
Platelets: 179 10*3/uL (ref 150.0–400.0)
RBC: 4.05 Mil/uL (ref 3.87–5.11)
RDW: 12.1 % (ref 11.5–15.5)
WBC: 5.2 10*3/uL (ref 4.0–10.5)

## 2019-07-24 LAB — COMPREHENSIVE METABOLIC PANEL
ALT: 17 U/L (ref 0–35)
AST: 24 U/L (ref 0–37)
Albumin: 4.1 g/dL (ref 3.5–5.2)
Alkaline Phosphatase: 66 U/L (ref 39–117)
BUN: 35 mg/dL — ABNORMAL HIGH (ref 6–23)
CO2: 28 mEq/L (ref 19–32)
Calcium: 9.4 mg/dL (ref 8.4–10.5)
Chloride: 103 mEq/L (ref 96–112)
Creatinine, Ser: 1.07 mg/dL (ref 0.40–1.20)
GFR: 48.51 mL/min — ABNORMAL LOW (ref >60.00)
Glucose, Bld: 95 mg/dL (ref 70–99)
Potassium: 4.4 mEq/L (ref 3.5–5.1)
Sodium: 138 mEq/L (ref 135–145)
Total Bilirubin: 0.4 mg/dL (ref 0.2–1.2)
Total Protein: 7.1 g/dL (ref 6.0–8.3)

## 2019-07-24 LAB — TSH: TSH: 2.09 u[IU]/mL (ref 0.35–4.50)

## 2019-07-24 NOTE — Progress Notes (Signed)
   I, Rachel Ferguson, LAT, ATC, am serving as scribe for Dr. [name].  Rachel Ferguson is a 86 y.o. female who presents to [facility] at [facility] today for f/u of her R hip pain.  She was last seen by 06/04/19 and was referred to home health PT.  She completed home health PT and was d/c-ed on 07/06/19 due to meeting her goals.  At her last visit, the pt rated her R hip pain at a 5/10 and described her pain as throbbing pain w/ standing and walking.  She denied any radiating pain or numbness/tingling into her R LE.  Since her last visit, pt reports initial improved pain w/ PT but then had a return of pain while out walking for exercise.  She rates her current pain at a 2/10 and a 8/10 at it's worst and describes her pain as aching and throbbing.  She denies any radiating pain into her R thigh and denies any numbness/tingling into her R LE.  She has been icing her hip which does help.     Pertinent review of systems: No fevers or chills.  Relevant historical information: Alzheimer   Exam:  BP 138/84 (BP Location: Left Arm, Patient Position: Sitting, Cuff Size: Normal)   Pulse 61   Ht 5' 4.5" (1.638 m)   Wt 129 lb 3.2 oz (58.6 kg)   SpO2 98%   BMI 21.83 kg/m  General: Well Developed, well nourished, and in no acute distress.   MSK: Right hip: Normal-appearing normal motion. Not particularly tender palpation at greater trochanter.  Some tenderness along gluteus medius muscular course. Hip abductor strength diminished 4/5.  External rotation full strength intact 5/5.  Internal rotation and adduction normal. Normal gait.      Assessment and Plan: 86 y.o. female with right hip pain.  Hip abductor tendinopathy/trochanteric bursitis.  Complicated by dementia.  Patient had incomplete physical therapy and is not clear how much she was able to really do.  Fortunately she is feeling a bit better today.  She had an exacerbation recently with a long walk.  I am hopeful that if she  continues her home exercise program she will continue to experience benefit.  Reviewed home exercises with the patient and her son.  Plan to check back in about a month.  Return sooner if needed.    Discussed warning signs or symptoms. Please see discharge instructions. Patient expresses understanding.   The above documentation has been reviewed and is accurate and complete [name]

## 2019-07-24 NOTE — Patient Instructions (Addendum)
Thank you for coming in today.  Work on the cross over stretch. Do the side leg raise 30 times in a row 2-3x daily.  If this does not help we can do a cortisone shot.  Recheck in 1 month.   If you still have dizziness check back with Dr Okey Dupre.   Recheck with me in 1 month or sooner if needed.

## 2019-07-24 NOTE — Progress Notes (Signed)
Rachel Ferguson is a 86 y.o. female with the following history as recorded in EpicCare:  Patient Active Problem List   Diagnosis Date Noted  . Medication overdose 04/25/2018  . Dysuria 04/20/2018  . Hearing loss due to cerumen impaction, right 02/22/2018  . Right hip pain 10/11/2017  . Memory change 09/12/2017  . Late onset Alzheimer's disease without behavioral disturbance ([place]) 03/29/2017  . HTN (hypertension) 11/20/2016  . Low back pain 07/02/2016  . Nodule of apex of right lung 05/27/2016  . Gastroesophageal reflux disease without esophagitis 12/27/2013  . Hypothyroidism, acquired 10/11/2008  . Insomnia, unspecified 10/11/2008  . Hyperlipidemia 08/06/2008    Current Outpatient Medications  Medication Sig Dispense Refill  . levothyroxine (SYNTHROID) 75 MCG tablet TAKE 1 TABLET (75 MCG TOTAL) BY MOUTH DAILY BEFORE BREAKFAST. 30 tablet 0  . mirtazapine (REMERON) 7.5 MG tablet Take 3 tablets every night 90 tablet 11  . Multiple Vitamin (MULTI-VITAMIN PO) Take 1 tablet by mouth.    Marland Kitchen omeprazole (PRILOSEC) 20 MG capsule TAKE 1 CAPSULE DAILY 90 capsule 3   No current facility-administered medications for this visit.    Allergies: Ciprofloxacin  Past Medical History:  Diagnosis Date  . Colon polyps   . GERD (gastroesophageal reflux disease)   . Migraines   . Osteopenia   . Thyroid disease     Past Surgical History:  Procedure Laterality Date  . KNEE SURGERY Right   . TONSILLECTOMY AND ADENOIDECTOMY      Family History  Problem Relation Age of Onset  . Cancer Mother        breast  . Heart disease Father   . Alzheimer's disease Sister   . Heart disease Maternal Grandmother   . Tuberculosis Paternal Grandmother   . Cancer Sister   . Melanoma Sister     Social History   Tobacco Use  . Smoking status: Former Smoker    Packs/day: 0.25    Years: 5.00    Pack years: 1.25    Quit date: 05/05/1956    Years since quitting: 63.2  . Smokeless tobacco: Never Used  Substance  Use Topics  . Alcohol use: Yes    Alcohol/week: 14.0 standard drinks    Types: 14 Standard drinks or equivalent per week    Comment: 1-2 glasses red wine nightly    Subjective:   Patient is accompanied by her son. She has apparent memory issues/ recently diagnosed with Alzheimers; she had called yesterday with concerns that her blood pressure was high. Her family was under the impression that she takes blood pressure medication. Her son notes she recently threw all her medications away and they were concerned she needs a blood pressure medication refill. Upon further discussion, the patient notes she is having some dizziness but denies any sense that the room is spinning around her; admits she has not been taking her Levothyroxine daily; son notes that he and his siblings plan to start helping with medication administration for his mother. She denies any headache, blurred vision, chest pain.   Objective:  Vitals:   07/24/19 1100  BP: 132/80  Pulse: (!) 57  Temp: 98 F (36.7 C)  TempSrc: Oral  SpO2: 97%  Weight: 129 lb (58.5 kg)  Height: 5' 4.5" (1.638 m)    General: Well developed, well nourished, in no acute distress  Skin : Warm and dry.  Head: Normocephalic and atraumatic  Eyes: Sclera and conjunctiva clear; pupils round and reactive to light; extraocular movements intact  Ears: External normal;  canals clear; tympanic membranes normal  Oropharynx: Pink, supple. No suspicious lesions  Neck: Supple without thyromegaly, adenopathy  Lungs: Respirations unlabored; clear to auscultation bilaterally without wheeze, rales, rhonchi  CVS exam: normal rate and regular rhythm.  Neurologic: Alert and oriented; speech intact; face symmetrical; moves all extremities well; CNII-XII intact without focal deficit   Assessment:  1. Dizziness   2. Hypothyroidism, acquired   3. Late onset Alzheimer's disease without behavioral disturbance ([place])     Plan:  Extensive medication review done with  patient and her son; patient leaves with up to date medication list; repeatedly stressed the need to take her medications daily as prescribed. They plan to keep the neurology follow-up tomorrow; Patient's blood pressure is normal today and was normal at her visit with sports medicine earlier this morning; discussed healthy diet/ salt restriction but does not need any medication. Will check CBC, CMP, TSH today; should plan to see her PCP in follow-up with continued concerns or questions.  This visit occurred during the SARS-CoV-2 public health emergency.  Safety protocols were in place, including screening questions prior to the visit, additional usage of staff PPE, and extensive cleaning of exam room while observing appropriate contact time as indicated for disinfecting solutions.   Spent a total of 35 minutes coordinating care today;  No follow-ups on file.  Orders Placed This Encounter  Procedures  . CBC w/Diff  . Comp Met (CMET)  . TSH    Requested Prescriptions    No prescriptions requested or ordered in this encounter

## 2019-07-25 ENCOUNTER — Telehealth (INDEPENDENT_AMBULATORY_CARE_PROVIDER_SITE_OTHER): Payer: Medicare Other | Admitting: Neurology

## 2019-07-25 VITALS — Ht 60.5 in | Wt 128.0 lb

## 2019-07-25 DIAGNOSIS — G301 Alzheimer's disease with late onset: Secondary | ICD-10-CM

## 2019-07-25 DIAGNOSIS — F0281 Dementia in other diseases classified elsewhere with behavioral disturbance: Secondary | ICD-10-CM

## 2019-07-25 MED ORDER — MIRTAZAPINE 7.5 MG PO TABS: ORAL_TABLET | ORAL | 11 refills | Status: DC

## 2019-07-25 NOTE — Progress Notes (Signed)
Virtual Visit via Video Note The purpose of this virtual visit is to provide medical care while limiting exposure to the novel coronavirus.    Consent was obtained for video visit:  Yes.   Answered questions that patient had about telehealth interaction:  Yes.   I discussed the limitations, risks, security and privacy concerns of performing an evaluation and management service by telemedicine. I also discussed with the patient that there may be a patient responsible charge related to this service. The patient expressed understanding and agreed to proceed.  Pt location: Home Physician Location: office Name of referring provider:  Myrlene Ferguson, * I connected with Rachel Ferguson at patients initiation/request on 07/25/2019 at 10:00 AM EST by video enabled telemedicine application and verified that I am speaking with the correct person using two identifiers. Pt MRN:  [id number] Pt DOB:  03-22-1931 Video Participants:  Rachel Ferguson;  Rachel Ferguson (son)   History of Present Illness:  The patient was seen as a virtual video visit on 08/03/2019. Her son Rachel Ferguson is present during the visit to provide additional information. Since her last visit, she has called our office several times about her driving. We have repeatedly told her that she cannot drive, and a second opinion was obtained from [facility]. Records were reviewed, MMSE 24/30, unable to complete Trail Making tasks and clock drawing. She was diagnosed with mild dementia, likely Alzheimer's type, with no insight into her disease. There were deficits in executive functioning, and recommendation was that she may not drive. She was very angry to hear this news again. She was prescribed Donepezil. She presents for follow-up today to again discuss driving. She reports she has been great and she sees no reason at all why she should not be able to drive. She repeatedly insisted I read pages 10-11 of Dr. Ruben Reason Gupta's book Keep  Lambert Mody, saying "my brain is as good as yours." She has not been taking the Donepezil. She is still taking mirtazapine 7.5mg  3 tabs qhs for sleep, they report sleep is good.   History on Initial Assessment 01/03/2017: This is an 86 yo RH woman with a history of hypertension, hypothyroidism, depression, who presented for evaluation of dizziness and word-finding difficulties. She was admitted to [facility] in June 2078for dizziness worse with movements. She had an MRI brain that was reportedly normal. TSH normal. She drove herself to her hair appointment and as she walked in, she had another spell where she felt she had to lay down. She lay down and noticed her heart rate on the Apple watch was 158 bpm. Symptoms lasted a few minutes, she called her PCP who instructed her to go to the ER. She kept repeating during the visit that it was a big mistake to go to the ER. During her hospitalization, it was noted that she became extremely combative, yelling at nursing staff and physicians, stating she just wanted to sleep and be left alone. She could not comprehend why testing was needed to be done for the dizziness. She finally agreed to an echocardiogram, which was normal, EF 60-65%. There was no arrhythmia on telemetry for over 12 hours, it was agreed to discharge her home and had an outpatient event monitor. She has refused this and states she has not had any further dizziness or other issues. She was taken off her BP medication on last PCP visit. She does recall her hospital stay, saying her head was not quite right, not spinning, "like a  funny feeling," no headache, tinnitus, nausea/vomiting, focal numbness/tingling. She states "I wish I had not gone, I'm sorry I did." She reports BP has been better, she has been travelling and feels normal. She reports her memory "could be better," she does not she forgets events but had a strenuous trip. She lives with her son and grandson, who are not present today. According to her  daughter, she started noticing gradual memory decline since the patient's spouse passed away 6 years ago. She loses her phone all the time. She forgot her medications maybe once. Her daughter reports she has lost her car several times, the patient gets upset and reports it has only happened twice, and there was a reason for them. One time she could not find her car after seeing her doctor in June. Her other son has had to help her find her car a few times. She gets more disoriented driving, especially in [place], where she used to live for many years. She states she has a wonderful GPS. Her children report that if GPS is unclear, she has a problem. Even going to the doctor's office, she could not remember exactly where it was. She has good and bad days. She got upset when her daughter reported problems with executive functioning, citing an instance with unpacking boxes into their remodeled house. She was "leaving trails" everywhere, she states she was trying to get order out of chaos. Her daughter reports impulsivity and concerns about her judgement. She has never struggled with anxiety, but several times recently, she has gotten panicked. One instance was when she was on the way to the hospital, she was anxious and panicked. She denies any missed bill payments. She denies any headaches, any further dizziness, diplopia, dysarthria/dysphagia, neck/back pain, focal numbness/tingling/weakness, bowel/bladder dysfunction, anosmia, or tremors. No difficulties with ADLs, no hallucinations. Her older sister had dementia. She denies any history of significant head injuries. She drinks at least one glass of red wine most every night.   Diagnostic Data: Neuropsychological report indicates "Specific areas of cognitive impairment included semantic retrieval, comprehension of complex auditory material, and executive functioning (mental flexibility, set shifting, clock drawing). While memory consolidation was intact, the  amount of information that could be encoded was reduced. The patient's level of cognitive impairment, alongside changes in daily functioning (e.g., driving, management of finances, appointments), meet diagnostic criteria for a dementia syndrome. Of note, because of her above-average intellectual abilities, her testing results may even over-estimate her true abilities. I would still characterize stage of dementia as mild, approaching moderate." Taken together with cognitive profile concerning for medial temporal lobe involvement and MRI showing only mild small vessel disease, results support a diagnosis of Alzheimer's disease.   Observations/Objective:   General: not in acute distress. Neurological exam: Patient is awake, alert, oriented x 3. No aphasia or dysarthria. Intact fluency and comprehension. Remote and recent memory impaired. Cranial nerves: Extraocular movements intact with no nystagmus. No facial asymmetry. Motor: moves all extremities symmetrically, at least anti-gravity x 4.  Assessment and Plan:   This is an 86 yo RH woman with a history of hypertension, hypothyroidism, depression, with dementia, most likely due to Alzheimer's disease (diagnosed in October 2018 Neuropsychological evaluation). MRI brain no acute changes, mild chronic microvascular disease. She again has no insight into her condition and again would like to argue about her driving privileges. I discussed with her that recommendation for no driving is based on her Neurocognitive testing, she states "if you have read the book, you  will know that the testing does not amount to nothing." I discussed with her that [facility]ilyn[facility] Drivers has made the same driving recommendations. She is asking to change my recommendations on DMV paperwork, I discussed with her that this is not possible. She has been sleeping well, her son asks about weaning off mirtazapine, can reduce to 2 tabs qhs for 2 weeks, and if no issues, 1 tab qhs x 2 weeks, then stop. She  does not want to start Donepezil. She can follow-up as needed, however if visits will be to discuss driving restrictions, I have nothing further to offer.   Follow Up Instructions: -I discussed the assessment and treatment plan with the patient/family. The patient/family were provided an opportunity to ask questions and all were answered. The patient/family agreed with the plan and demonstrated an understanding of the instructions.   The patient was advised to call back or seek an in-person evaluation if the symptoms worsen or if the condition fails to improve as anticipated.     Van Clines, MD

## 2019-07-30 ENCOUNTER — Encounter: Payer: Self-pay | Admitting: Family Medicine

## 2019-07-30 ENCOUNTER — Ambulatory Visit (INDEPENDENT_AMBULATORY_CARE_PROVIDER_SITE_OTHER): Payer: Medicare Other | Admitting: Family Medicine

## 2019-07-30 ENCOUNTER — Other Ambulatory Visit: Payer: Self-pay

## 2019-07-30 DIAGNOSIS — M25551 Pain in right hip: Secondary | ICD-10-CM

## 2019-07-30 NOTE — Progress Notes (Signed)
   I, Rachel Ferguson, LAT, ATC, am serving as scribe for Dr. Clementeen Ferguson.  Rachel Ferguson is a 86 y.o. female who presents to [facility] at [facility] today for f/u of R hip/greater trochanter pain.  She was last seen by Dr. Denyse Ferguson on 07/24/19 w/ c/o aching/throbbing R lateral hip pain w/ prolonged walking.  She was shown some new home exercises by Dr. Denyse Ferguson.  She was advised to return for an injection if her symptoms did not improve and would like to have an injection today.  She reports no change in her symptoms since last week.  Patient wishes to walk 10,000 steps a day.  She notes that if she does her stretches she is generally able to do that.  She is having a bit of pain and thinks that injection may help.   Pertinent review of systems: No fevers or chills.  Relevant historical information: Hypertension   Exam:  BP 138/78 (BP Location: Left Arm, Patient Position: Sitting, Cuff Size: Normal)   Pulse 67   Ht 5' 0.5" (1.537 m)   Wt 130 lb 3.2 oz (59.1 kg)   SpO2 94%   BMI 25.01 kg/m  General: Well Developed, well nourished, and in no acute distress.   MSK: Right hip: Normal-appearing normal motion.  Tender palpation greater trochanter. Hip abduction strength is intact.  Hip greater trochanteric injection: Right Consent obtained and timeout performed. Area of maximum tenderness palpated and identified. Skin cleaned with alcohol, cold spray applied. A 22g needle was used to access the greater trochanteric bursa. 40 mg of kenalog and 4 mL of Marcaine were used to inject the trochanteric bursa. Patient tolerated the procedure well.     Assessment and Plan: 86 y.o. female with right lateral hip pain hip abductor tendinopathy/trochanteric bursitis.  Plan for injection.  Again emphasized home exercise program and stretching.  Recheck back in 2 months return sooner if needed.  Precautions reviewed.     Discussed warning signs or symptoms. Please see discharge  instructions. Patient expresses understanding.   The above documentation has been reviewed and is accurate and complete Rachel Ferguson

## 2019-07-30 NOTE — Patient Instructions (Addendum)
Thank you for coming in today. Keep exercise. Ok to keep walking if not hurting too much.  Call or go to the ER if you develop a large red swollen joint with extreme pain or oozing puss.  Recheck as needed.  Dont walk past the point of pain.  Lets plan on a recheck in about 2 months.  OK to cancel or schedule sooner if needed.

## 2019-08-03 ENCOUNTER — Encounter: Payer: Self-pay | Admitting: Neurology

## 2019-08-07 ENCOUNTER — Ambulatory Visit: Payer: Medicare Other | Admitting: Internal Medicine

## 2019-08-13 ENCOUNTER — Telehealth: Payer: Self-pay | Admitting: Internal Medicine

## 2019-08-13 NOTE — Telephone Encounter (Signed)
New message:   Pt states she is unable to sleep and also states some medication was given to her for this issue but it does not work.

## 2019-08-14 ENCOUNTER — Other Ambulatory Visit: Payer: Self-pay | Admitting: Internal Medicine

## 2019-08-15 ENCOUNTER — Telehealth: Payer: Self-pay | Admitting: Internal Medicine

## 2019-08-15 ENCOUNTER — Telehealth: Payer: Self-pay

## 2019-08-15 NOTE — Telephone Encounter (Signed)
New message    Patient calling went three-night unable to sleep, called on 08/13/19 with no response.   mirtazapine (REMERON) 7.5 MG tablet is not working   Asking for a callback today to discuss further options.

## 2019-08-15 NOTE — Telephone Encounter (Signed)
New message:   Pt's guardian is calling and states can we please prescribe the patient with something that would help her to sleep. She has not been sleeping well at all states the guardian. Please advise.

## 2019-08-15 NOTE — Telephone Encounter (Signed)
Called pt daughter Lanora Manis) she states mom is having problems with sleeping. She states mom gets herself all worked up, and she's been anxious. She haas not had an good night sleep. Inform daughter mom would need OV or Virtual visit before Dr. Okey Dupre can rx something. Daughter states she has spoken with Dr. Okey Dupre in the past, and she knows mom history. Inform daughter will send MD the msg and give her an call back w/her recommendations. Daughter is aware MD is out of the office and will be tomorrow before we get an response.Marland KitchenRaechel Chute

## 2019-08-16 NOTE — Telephone Encounter (Signed)
Would need visit as she already has a sleeping medicine so we would need to discuss adding something versus change of medicine.

## 2019-08-16 NOTE — Telephone Encounter (Signed)
Called pt, LVM.   

## 2019-08-22 ENCOUNTER — Encounter: Payer: Self-pay | Admitting: Family Medicine

## 2019-08-22 ENCOUNTER — Ambulatory Visit (INDEPENDENT_AMBULATORY_CARE_PROVIDER_SITE_OTHER): Payer: Medicare Other | Admitting: Family Medicine

## 2019-08-22 ENCOUNTER — Other Ambulatory Visit: Payer: Self-pay

## 2019-08-22 VITALS — BP 120/80 | HR 71 | Ht 60.5 in | Wt 127.0 lb

## 2019-08-22 DIAGNOSIS — M76891 Other specified enthesopathies of right lower limb, excluding foot: Secondary | ICD-10-CM | POA: Insufficient documentation

## 2019-08-22 NOTE — Patient Instructions (Signed)
Thank you for coming in today. Plan for hip strength exercises.  OK to continue walk if feeling well.   Do the side leg raises 30 reps 2-3x daily.  Do the hip bridge/hip raises 30 reps 2-3x daily.  If this is not enough I can try to get home health physical to come out to your house again.   Recheck with me in 6 weeks.

## 2019-08-22 NOTE — Progress Notes (Signed)
   I, Christoper Fabian, LAT, ATC, am serving as scribe for Dr. [name].  Rachel Ferguson is a 86 y.o. female who presents to [facility] at [facility] today for f/u of R lateral hip pain.  She is an avid walker and wants to walk 10,000 steps/day but has pain w/ prolonged walking. She was last seen by Dr. Denyse Amass on 07/30/19 and had a R greater trochanter injection.  She has been shown a HEP by Dr. Denyse Amass.  Since her last visit, pt reports cannot walk with the same ease in which she used to be able to.   Pertinent review of systems: No fevers or chills  Relevant historical information: Mild to moderate dementia Patient had trial of home health PT but was discharged   Exam:  BP 120/80 (BP Location: Left Arm, Patient Position: Sitting, Cuff Size: Normal)   Pulse 71   Ht 5' 0.5" (1.537 m)   Wt 127 lb (57.6 kg)   SpO2 98%   BMI 24.39 kg/m  General: Well Developed, well nourished, and in no acute distress.   MSK: Right hip normal-appearing nontender normal motion. Hip abduction strength diminished 4/5. Hip external rotation strength diminished 4/5.  Internal rotation and adduction strength intact.     Assessment and Plan: 86 y.o. female with continued mild weakness right lateral hip.  Weakness of hip abductors and external rotators.  Continue to emphasize home exercise program.  She is doing stretching but no strengthening program.  Retaught strengthening program.  Will consider retrial of home health physical therapy. Recheck back in 4 to 6 weeks.  Total encounter time 20 minutes including charting time date of service. Time spent discussing plan and reviewing home exercise program.  Home exercise program taught by MD in clinic today.  Discussed warning signs or symptoms. Please see discharge instructions. Patient expresses understanding.   The above documentation has been reviewed and is accurate and complete [name]

## 2019-08-24 ENCOUNTER — Ambulatory Visit: Payer: Medicare Other | Admitting: Family Medicine

## 2019-09-24 ENCOUNTER — Ambulatory Visit (INDEPENDENT_AMBULATORY_CARE_PROVIDER_SITE_OTHER): Payer: Medicare Other | Admitting: Family

## 2019-09-24 ENCOUNTER — Encounter: Payer: Self-pay | Admitting: Family

## 2019-09-24 ENCOUNTER — Ambulatory Visit (INDEPENDENT_AMBULATORY_CARE_PROVIDER_SITE_OTHER): Payer: Medicare Other

## 2019-09-24 ENCOUNTER — Other Ambulatory Visit: Payer: Self-pay

## 2019-09-24 VITALS — BP 128/68 | HR 65 | Temp 98.0°F | Ht 60.5 in | Wt 128.0 lb

## 2019-09-24 DIAGNOSIS — M25572 Pain in left ankle and joints of left foot: Secondary | ICD-10-CM | POA: Diagnosis not present

## 2019-09-24 DIAGNOSIS — L989 Disorder of the skin and subcutaneous tissue, unspecified: Secondary | ICD-10-CM

## 2019-09-24 NOTE — Patient Instructions (Signed)
Please call your dermatologist about the area of concern on your face.

## 2019-09-24 NOTE — Progress Notes (Signed)
Rachel Ferguson is a 86 y.o. female with the following history as recorded in EpicCare:  Patient Active Problem List   Diagnosis Date Noted  . Tendinitis involving right hip abductors 08/22/2019  . Medication overdose 04/25/2018  . Dysuria 04/20/2018  . Hearing loss due to cerumen impaction, right 02/22/2018  . Right hip pain 10/11/2017  . Memory change 09/12/2017  . Late onset Alzheimer's disease without behavioral disturbance ([place]) 03/29/2017  . HTN (hypertension) 11/20/2016  . Low back pain 07/02/2016  . Nodule of apex of right lung 05/27/2016  . Gastroesophageal reflux disease without esophagitis 12/27/2013  . Hypothyroidism, acquired 10/11/2008  . Insomnia, unspecified 10/11/2008  . Hyperlipidemia 08/06/2008    Current Outpatient Medications  Medication Sig Dispense Refill  . Biotin 5000 MCG SUBL Take by mouth.    . cyanocobalamin 1000 MCG tablet Take by mouth.    . FIBER ADULT GUMMIES PO Take by mouth.    . levothyroxine (SYNTHROID) 75 MCG tablet TAKE 1 TABLET (75 MCG TOTAL) BY MOUTH DAILY BEFORE BREAKFAST. 30 tablet 0  . mirtazapine (REMERON) 7.5 MG tablet Take 2 tablets every night 60 tablet 11  . Multiple Vitamin (MULTI-VITAMIN PO) Take 1 tablet by mouth.    . Multiple Vitamins-Minerals (MULTIPLE VITAMINS/WOMENS) tablet Take 1 tablet by mouth daily.    . multivitamin-lutein (OCUVITE-LUTEIN) CAPS capsule Take 1 capsule by mouth daily.    Marland Kitchen omega-3 fish oil (MAXEPA) 1000 MG CAPS capsule Take by mouth.    Marland Kitchen omeprazole (PRILOSEC) 20 MG capsule TAKE 1 CAPSULE DAILY 90 capsule 3  . Turmeric (QC TUMERIC COMPLEX PO) Take by mouth.    . donepezil (ARICEPT) 5 MG tablet Take 5mg  nightly for 90 days, and then take 10mg  nightly.     No current facility-administered medications for this visit.    Allergies: Ciprofloxacin  Past Medical History:  Diagnosis Date  . Colon polyps   . GERD (gastroesophageal reflux disease)   . Migraines   . Osteopenia   . Thyroid disease     Past  Surgical History:  Procedure Laterality Date  . KNEE SURGERY Right   . TONSILLECTOMY AND ADENOIDECTOMY      Family History  Problem Relation Age of Onset  . Cancer Mother        breast  . Heart disease Father   . Alzheimer's disease Sister   . Heart disease Maternal Grandmother   . Tuberculosis Paternal Grandmother   . Cancer Sister   . Melanoma Sister     Social History   Tobacco Use  . Smoking status: Former Smoker    Packs/day: 0.25    Years: 5.00    Pack years: 1.25    Quit date: 05/05/1956    Years since quitting: 63.4  . Smokeless tobacco: Never Used  Substance Use Topics  . Alcohol use: Yes    Alcohol/week: 14.0 standard drinks    Types: 14 Standard drinks or equivalent per week    Comment: 1-2 glasses red wine nightly    Subjective:  Complaining of swelling in right ankle/ foot x 1 week; no known injury or trauma; does note that she walks daily; limited historian due to dementia and patient is unaccompanied today;     Objective:  Vitals:   09/24/19 1534  BP: 128/68  Pulse: 65  Temp: 98 F (36.7 C)  TempSrc: Oral  SpO2: 98%  Weight: 128 lb (58.1 kg)  Height: 5' 0.5" (1.537 m)    General: Well developed, well nourished, in no  acute distress  Skin : Warm and dry.  Head: Normocephalic and atraumatic  Lungs: Respirations unlabored; Musculoskeletal: No deformities; swelling noted over left lateral ankle Extremities: No edema, cyanosis, clubbing  Vessels: Symmetric bilaterally  Neurologic: Alert and oriented; speech intact; face symmetrical; moves all extremities well; CNII-XII intact without focal deficit   Assessment:  1. Acute left ankle pain   2. Skin lesion of face     Plan:  Update left foot and ankle x-ray; follow-up to be determined; Patient is also encouraged to follow up with her dermatologist regarding concern for changing lesion on right side of cheek; she is adamant that she has contact information for her dermatologist at home.   This  visit occurred during the SARS-CoV-2 public health emergency.  Safety protocols were in place, including screening questions prior to the visit, additional usage of staff PPE, and extensive cleaning of exam room while observing appropriate contact time as indicated for disinfecting solutions.     No follow-ups on file.  Orders Placed This Encounter  Procedures  . DG Foot Complete Left    Order Specific Question:   Reason for Exam (SYMPTOM  OR DIAGNOSIS REQUIRED)    Answer:   left foot pain    Order Specific Question:   Preferred imaging location?    Answer:   [facility]    Order Specific Question:   Radiology Contrast Protocol - do NOT remove file path    Answer:   \\[email or web address]s.[email or web address]  . DG Ankle Complete Left    Order Specific Question:   Reason for Exam (SYMPTOM  OR DIAGNOSIS REQUIRED)    Answer:   left ankle pain    Order Specific Question:   Preferred imaging location?    Answer:   [facility]    Order Specific Question:   Radiology Contrast Protocol - do NOT remove file path    Answer:   \\[email or web address]    Requested Prescriptions    No prescriptions requested or ordered in this encounter

## 2019-09-25 ENCOUNTER — Telehealth: Payer: Self-pay | Admitting: Internal Medicine

## 2019-09-25 NOTE — Telephone Encounter (Addendum)
    Patient states Dr Rachel Ferguson changed dosage of mirtazapine (REMERON) 7.5 MG tablet, however Dr Aquino's name listed Patient requesting refill Pharmacy : [id number] - [place], [place] - [address] AT [facility] OF [address] & [address]

## 2019-09-26 NOTE — Telephone Encounter (Signed)
I need more information about this message. What kind of change is she requesting I do not see that listed. I have not seen patient since December and that was for hip pain only. She last saw Dr. Karel Jarvis via video visit February so it is not likely that I have recently changed her remeron.

## 2019-09-27 ENCOUNTER — Ambulatory Visit: Payer: Medicare Other | Admitting: Family Medicine

## 2019-10-01 ENCOUNTER — Telehealth: Payer: Self-pay

## 2019-10-01 NOTE — Telephone Encounter (Signed)
Called patient requested medication has been corrected.   Nothing further is needed

## 2019-10-03 ENCOUNTER — Telehealth: Payer: Self-pay

## 2019-10-03 NOTE — Telephone Encounter (Signed)
Please advise 

## 2019-10-03 NOTE — Telephone Encounter (Signed)
New message    The patient driving privileges were taken away do not remember the MD's name asking Dr. Okey Dupre can she help her with this matter.

## 2019-10-04 ENCOUNTER — Other Ambulatory Visit: Payer: Self-pay | Admitting: Neurology

## 2019-10-04 NOTE — Telephone Encounter (Signed)
We have had many visits regarding this topic. I would be happy to see her in office and explain again to her about this if desired.

## 2019-10-09 NOTE — Telephone Encounter (Signed)
F/u   The patient is asking Dr. Okey Dupre to call her back to discuss driving privileges.

## 2019-10-09 NOTE — Telephone Encounter (Addendum)
Called and spoke with patient about message. She said that she only wants to drive a 10 mile radius to church and back

## 2019-10-10 NOTE — Telephone Encounter (Signed)
Called patient back, informed her that Dr. Okey Ferguson wants her to be seen before she can make that evaluation. Patient declined office visit

## 2019-10-23 ENCOUNTER — Telehealth: Payer: Medicare Other | Admitting: Internal Medicine

## 2019-10-23 ENCOUNTER — Telehealth: Payer: Self-pay | Admitting: Internal Medicine

## 2019-10-23 NOTE — Telephone Encounter (Signed)
    Patient calling, states she did not know about 5/11 virtual appointment.  Diarrhea for 2-3 weeks No other symptoms  Seeking advice for OTC/ prescribed medication. Patient declined to schedule virtual appointment

## 2019-10-23 NOTE — Progress Notes (Signed)
Patient was not present for visit. Will be rescheduled.

## 2019-10-24 ENCOUNTER — Telehealth (INDEPENDENT_AMBULATORY_CARE_PROVIDER_SITE_OTHER): Payer: Medicare Other | Admitting: Internal Medicine

## 2019-10-24 ENCOUNTER — Encounter: Payer: Self-pay | Admitting: Internal Medicine

## 2019-10-24 DIAGNOSIS — R197 Diarrhea, unspecified: Secondary | ICD-10-CM

## 2019-10-24 MED ORDER — DIPHENOXYLATE-ATROPINE 2.5-0.025 MG PO TABS: 1.0000 | ORAL_TABLET | Freq: Two times a day (BID) | ORAL | 0 refills | Status: AC | PRN

## 2019-10-24 NOTE — Telephone Encounter (Signed)
Patient had an telephone visit appointment on 10-24-2019.

## 2019-10-24 NOTE — Assessment & Plan Note (Signed)
Rx lomotil for short term usage. Overall decent control with imodium. If no improvement needs evaluation.

## 2019-10-24 NOTE — Progress Notes (Signed)
Virtual Visit via Audio-only Note  I connected with Rachel Ferguson on 10/24/19 at  9:20 AM EDT by a audio enabled telemedicine application and verified that I am speaking with the correct person using two identifiers.  The patient and the provider were at separate locations throughout the entire encounter.   I discussed the limitations of evaluation and management by telemedicine and the availability of in person appointments. The patient expressed understanding and agreed to proceed. The patient and the provider were the only parties present for the visit unless noted in HPI below.  History of Present Illness: The patient is a 86 y.o. female with visit for diarrhea. Started about 3 weeks ago. She is currently taking imodium and this helps most of the time. She denies new medications or dietary changes. Denies any recent antibiotic courses in the last 3 months. She has had moderna vaccine but cannot remember the timeline but several months ago. Has had this previously years ago.   Observations/Objective: Voice strong, A and O times 3, no cough or dyspnea  Assessment and Plan: See problem oriented charting  Follow Up Instructions: rx lomotil, if no improvement needs further evaluation  Visit time 8 minutes in non-face to face communication with patient and coordination of care  I discussed the assessment and treatment plan with the patient. The patient was provided an opportunity to ask questions and all were answered. The patient agreed with the plan and demonstrated an understanding of the instructions.   The patient was advised to call back or seek an in-person evaluation if the symptoms worsen or if the condition fails to improve as anticipated.  Myrlene Broker, MD

## 2019-10-25 ENCOUNTER — Other Ambulatory Visit: Payer: Self-pay | Admitting: Internal Medicine

## 2019-12-24 ENCOUNTER — Telehealth: Payer: Self-pay | Admitting: Internal Medicine

## 2019-12-24 NOTE — Telephone Encounter (Signed)
   Per daughter calling to report patient is now living in [place], [place]

## 2020-01-02 ENCOUNTER — Other Ambulatory Visit: Payer: Self-pay | Admitting: Neurology

## 2020-01-03 ENCOUNTER — Telehealth: Payer: Self-pay | Admitting: Internal Medicine

## 2020-01-03 NOTE — Telephone Encounter (Signed)
New message:   Pt is calling and states she would like to know if Dr. Okey Dupre has any recommendations for a new Dr for her in [place] since that is her new location. Please advise.

## 2020-05-30 ENCOUNTER — Other Ambulatory Visit: Payer: Self-pay | Admitting: Internal Medicine

## 2020-05-30 NOTE — Telephone Encounter (Signed)
Last I heard from this patient she had moved to [place] and was looking for a new provider there. Can you find out if they have a new PCP and update. If new provider should get meds from that provider.

## 2020-05-30 NOTE — Telephone Encounter (Signed)
 Controlled Database Checked Last filled:  10/24/19  # 15 LOV w/you:  10/24/19  Next appt w/you: None

## 2020-06-02 NOTE — Telephone Encounter (Signed)
I called pt- she states she does have a new PCP in [place]. I informed her we will disregard the rx request since she is no longer a patient here at [facility]. She will contact her new PCP for the Lomotil prescription.

## 2020-08-08 ENCOUNTER — Other Ambulatory Visit: Payer: Self-pay | Admitting: Internal Medicine

## 2020-10-13 IMAGING — DX LUMBAR SPINE - COMPLETE 4+ VIEW
5 series · 5 of 5 positions shown · non-contrast
Comparison: None.

CLINICAL DATA: Low back pain for 2 weeks.  No known injury.

EXAM:
LUMBAR SPINE - COMPLETE 4+ VIEW

[l-spine ap]
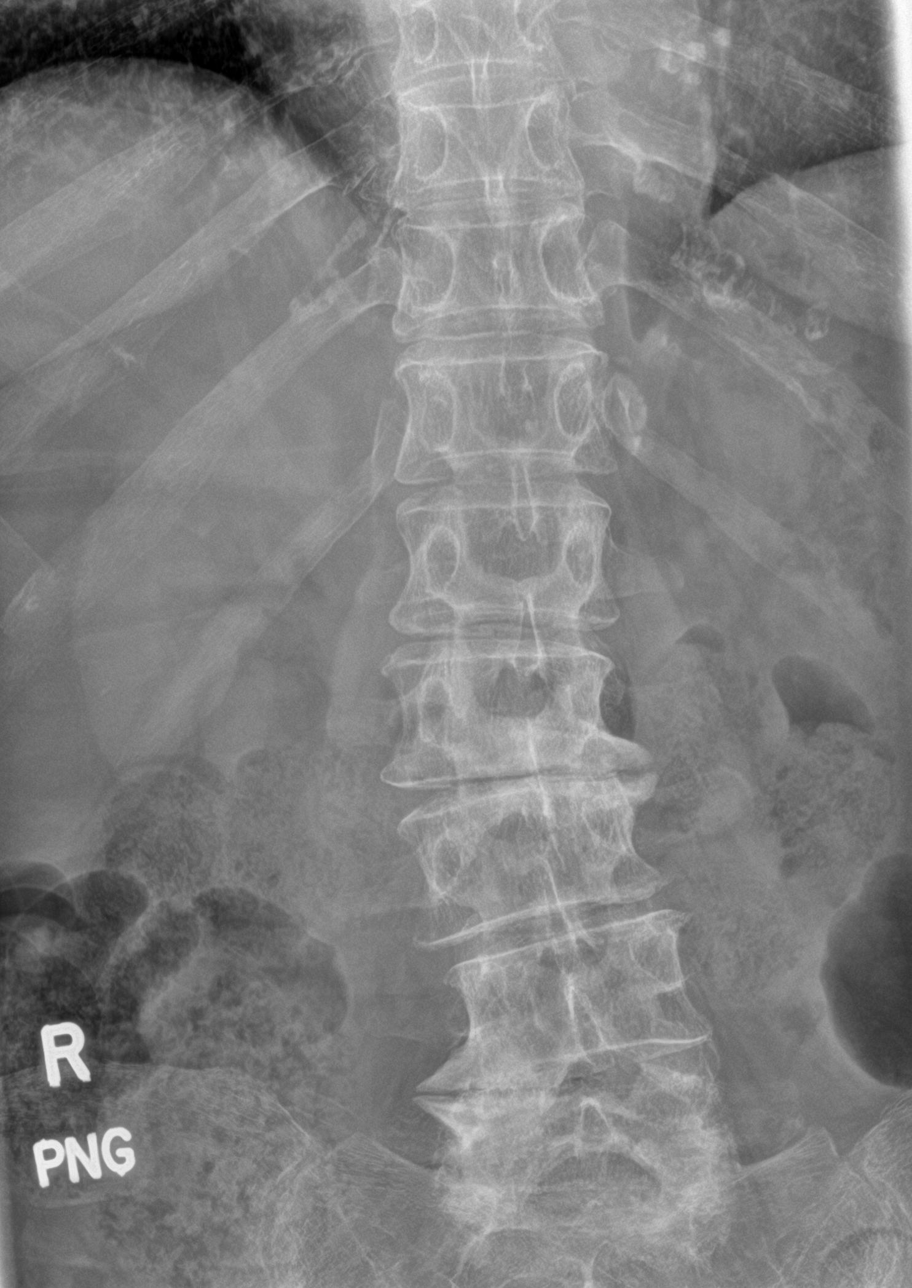

[l-spine obl (1 of 2)]
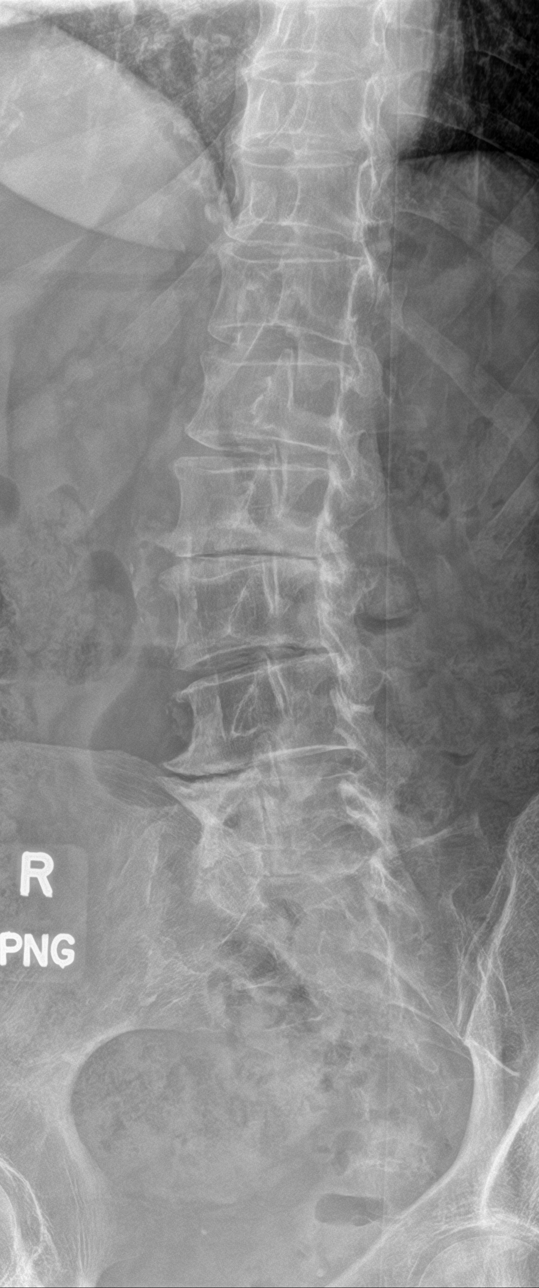

[l-spine obl (2 of 2)]
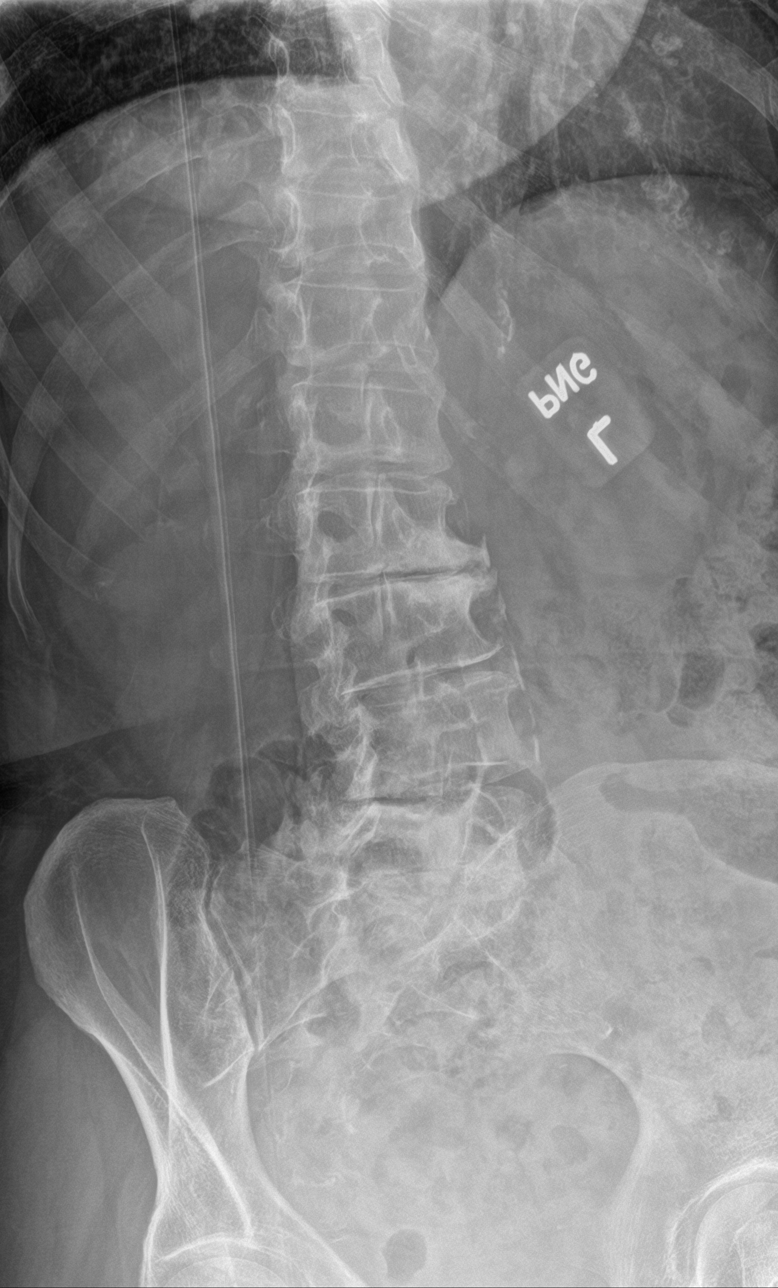

[l-spine lat]
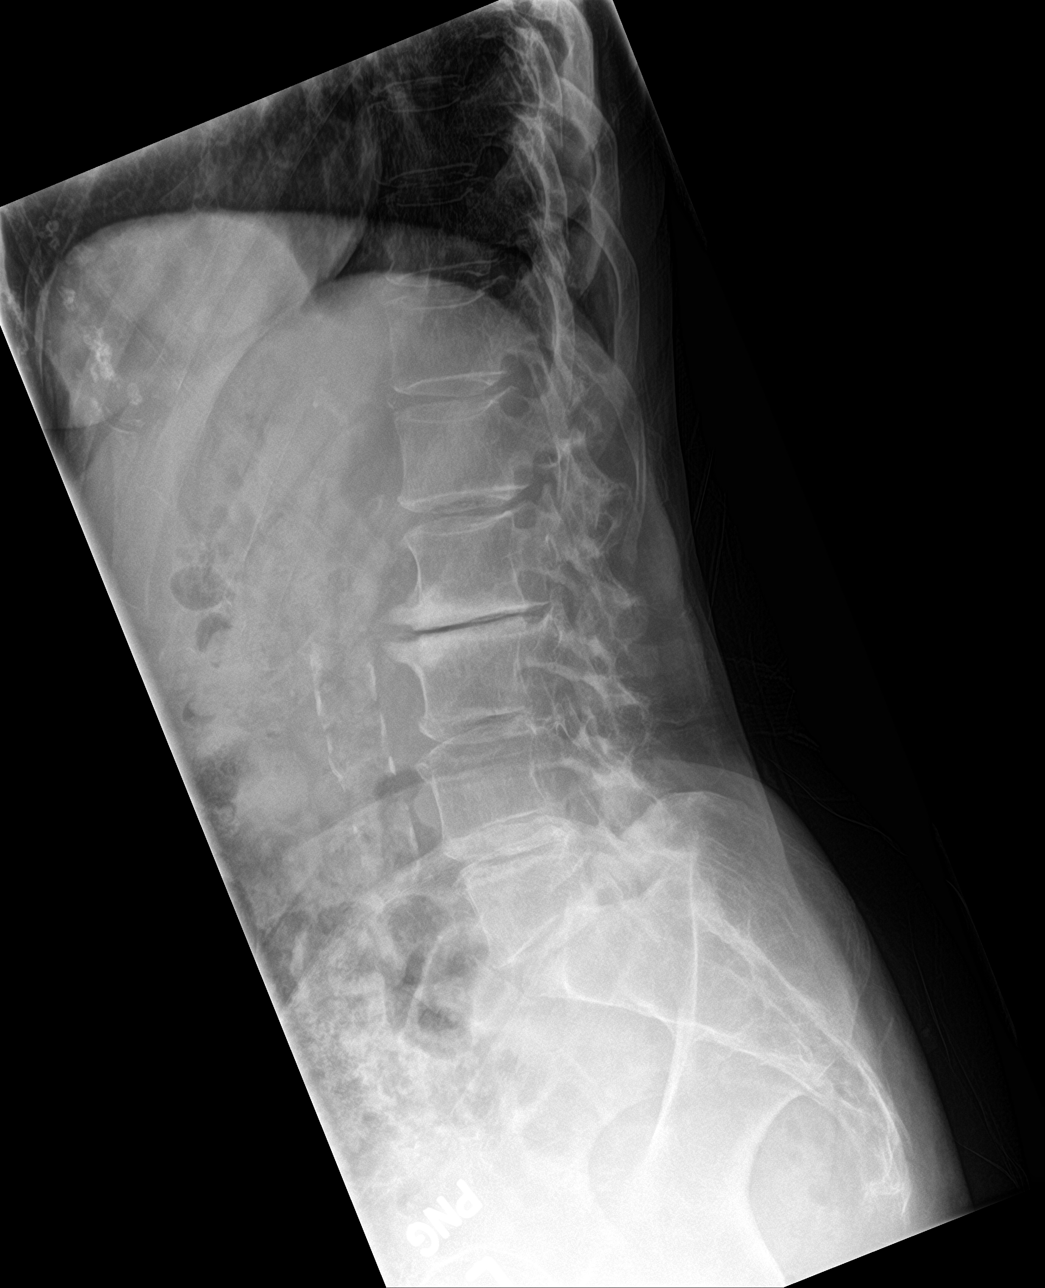

[l-spine spot]
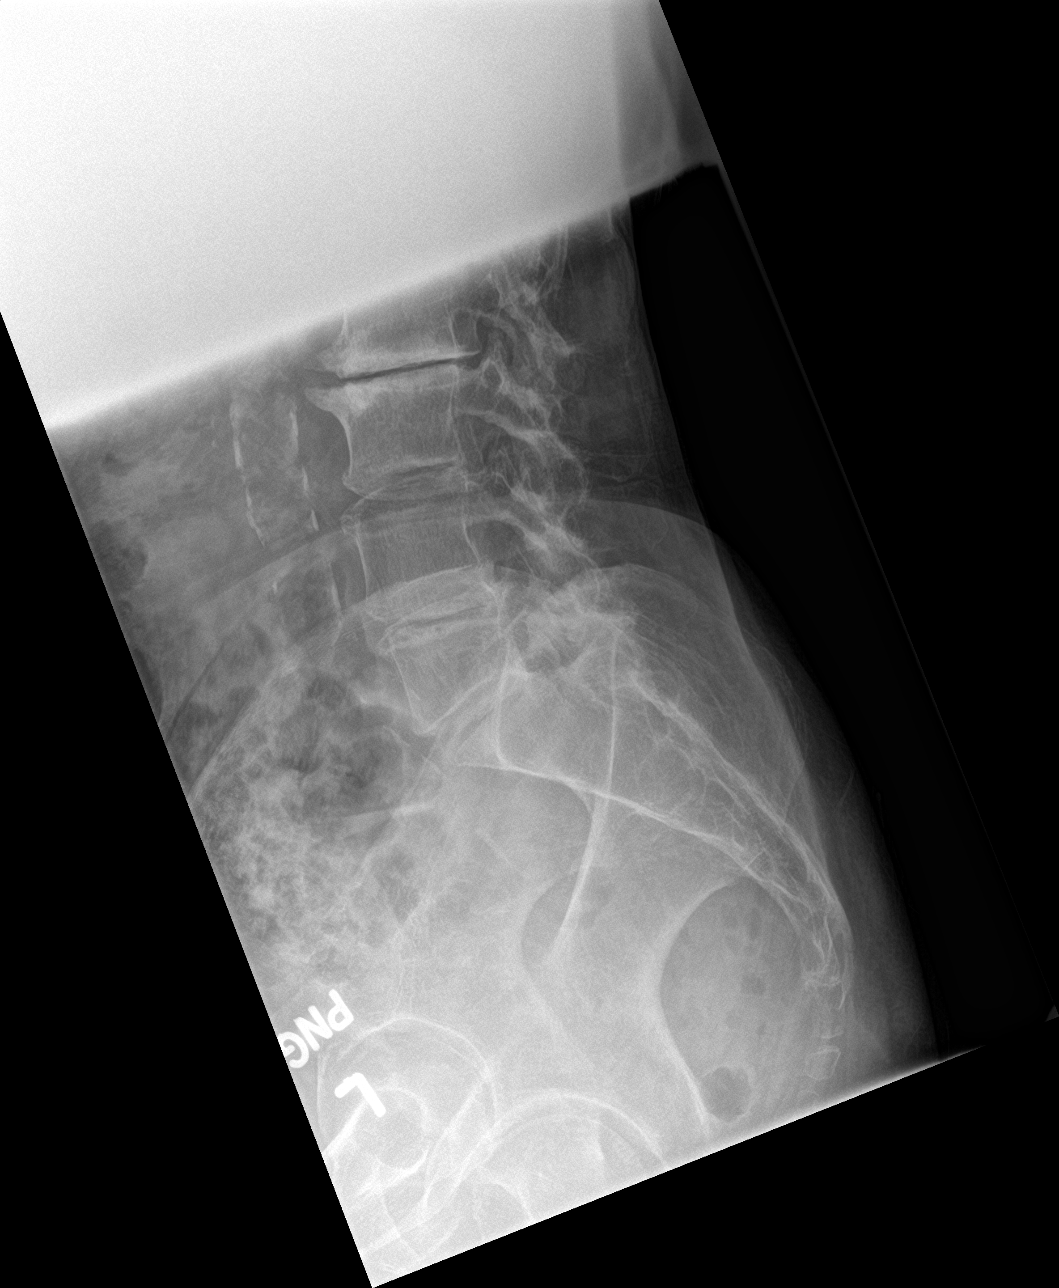

[5 of 5 positions shown; findings below may reference images not displayed]

FINDINGS: There are 5 non rib-bearing lumbar type vertebral bodies.

Mild scoliotic curvature of the thoracolumbar spine with dominant
mid component convex to the right measuring approximately 11 degrees
(as measured from the superior endplate of L1 to the inferior
endplate of L4). There is mild straightening of the expected lumbar
lordosis. No anterolisthesis or retrolisthesis. No definite pars
defects.

Lumbar vertebral body heights appear preserved given obliquity.

Moderate to severe multilevel lumbar spine DDD, worse at L2-L3 with
disc space height loss, endplate irregularity and sclerosis.

Atherosclerotic plaque within the abdominal aorta.

Large colonic stool burden without evidence of enteric obstruction.
IMPRESSION: 1. Mild scoliotic curvature of the thoracolumbar spine.
2.  Aortic Atherosclerosis ([id number]).

## 2020-10-20 ENCOUNTER — Other Ambulatory Visit: Payer: Self-pay | Admitting: Internal Medicine

## 2022-06-02 ENCOUNTER — Telehealth: Payer: Self-pay

## 2022-06-02 NOTE — Telephone Encounter (Signed)
Transition Care Management Unsuccessful Follow-up Telephone Call  Date of discharge and from where:  05/31/2022  Attempts:  1st Attempt  Reason for unsuccessful TCM follow-up call:  Missing or invalid number

## 2022-09-13 DEATH — deceased
